# Patient Record
Sex: Male | Born: 1950 | Race: White | Hispanic: No | Smoking: Former smoker
Health system: Southern US, Community
[De-identification: ages and names within clinical notes are randomized; demographics above are authoritative.]

## PROBLEM LIST (undated history)

## (undated) DIAGNOSIS — I4729 Other ventricular tachycardia: Secondary | ICD-10-CM

## (undated) DIAGNOSIS — R51 Headache: Principal | ICD-10-CM

## (undated) DIAGNOSIS — I2119 ST elevation (STEMI) myocardial infarction involving other coronary artery of inferior wall: Secondary | ICD-10-CM

## (undated) DIAGNOSIS — S0990XA Unspecified injury of head, initial encounter: Secondary | ICD-10-CM

## (undated) DIAGNOSIS — I10 Essential (primary) hypertension: Secondary | ICD-10-CM

## (undated) DIAGNOSIS — Z720 Tobacco use: Secondary | ICD-10-CM

## (undated) DIAGNOSIS — I472 Ventricular tachycardia: Secondary | ICD-10-CM

## (undated) DIAGNOSIS — E785 Hyperlipidemia, unspecified: Secondary | ICD-10-CM

## (undated) DIAGNOSIS — I240 Acute coronary thrombosis not resulting in myocardial infarction: Secondary | ICD-10-CM

## (undated) HISTORY — DX: Hyperlipidemia, unspecified: E78.5

## (undated) HISTORY — DX: Tobacco use: Z72.0

## (undated) HISTORY — DX: Acute coronary thrombosis not resulting in myocardial infarction: I24.0

## (undated) HISTORY — DX: Unspecified injury of head, initial encounter: S09.90XA

## (undated) HISTORY — DX: Ventricular tachycardia: I47.2

## (undated) HISTORY — DX: Essential (primary) hypertension: I10

## (undated) HISTORY — DX: Other ventricular tachycardia: I47.29

## (undated) HISTORY — DX: ST elevation (STEMI) myocardial infarction involving other coronary artery of inferior wall: I21.19

## (undated) HISTORY — DX: Headache: R51

---

## 1997-08-02 ENCOUNTER — Encounter
Admission: RE | Admit: 1997-08-02 | Discharge: 1997-10-31 | Payer: Self-pay | Admitting: Physical Medicine and Rehabilitation

## 2000-11-01 ENCOUNTER — Emergency Department (HOSPITAL_COMMUNITY): Admission: EM | Admit: 2000-11-01 | Discharge: 2000-11-01 | Payer: Self-pay | Admitting: Emergency Medicine

## 2000-11-01 ENCOUNTER — Encounter: Payer: Self-pay | Admitting: Emergency Medicine

## 2000-11-02 ENCOUNTER — Emergency Department (HOSPITAL_COMMUNITY): Admission: EM | Admit: 2000-11-02 | Discharge: 2000-11-02 | Payer: Self-pay | Admitting: Emergency Medicine

## 2000-11-08 ENCOUNTER — Encounter: Payer: Self-pay | Admitting: Urology

## 2000-11-08 ENCOUNTER — Ambulatory Visit (HOSPITAL_COMMUNITY): Admission: RE | Admit: 2000-11-08 | Discharge: 2000-11-08 | Payer: Self-pay | Admitting: Urology

## 2002-12-07 ENCOUNTER — Emergency Department (HOSPITAL_COMMUNITY): Admission: EM | Admit: 2002-12-07 | Discharge: 2002-12-07 | Payer: Self-pay | Admitting: Emergency Medicine

## 2002-12-07 ENCOUNTER — Encounter: Payer: Self-pay | Admitting: Emergency Medicine

## 2004-11-01 ENCOUNTER — Ambulatory Visit (HOSPITAL_COMMUNITY): Admission: RE | Admit: 2004-11-01 | Discharge: 2004-11-01 | Payer: Self-pay | Admitting: Family Medicine

## 2006-06-14 ENCOUNTER — Observation Stay (HOSPITAL_COMMUNITY): Admission: RE | Admit: 2006-06-14 | Discharge: 2006-06-15 | Payer: Self-pay | Admitting: Urology

## 2006-06-14 ENCOUNTER — Encounter (INDEPENDENT_AMBULATORY_CARE_PROVIDER_SITE_OTHER): Payer: Self-pay | Admitting: *Deleted

## 2006-06-14 HISTORY — PX: CYSTOTOMY: SHX926

## 2007-01-13 ENCOUNTER — Ambulatory Visit (HOSPITAL_COMMUNITY): Admission: RE | Admit: 2007-01-13 | Discharge: 2007-01-13 | Payer: Self-pay | Admitting: Urology

## 2008-01-30 ENCOUNTER — Ambulatory Visit (HOSPITAL_COMMUNITY): Admission: RE | Admit: 2008-01-30 | Discharge: 2008-01-30 | Payer: Self-pay | Admitting: Family Medicine

## 2008-02-07 ENCOUNTER — Emergency Department (HOSPITAL_COMMUNITY): Admission: EM | Admit: 2008-02-07 | Discharge: 2008-02-07 | Payer: Self-pay | Admitting: Emergency Medicine

## 2008-02-12 ENCOUNTER — Encounter: Admission: RE | Admit: 2008-02-12 | Discharge: 2008-02-12 | Payer: Self-pay | Admitting: Family Medicine

## 2008-02-18 ENCOUNTER — Ambulatory Visit (HOSPITAL_COMMUNITY): Admission: RE | Admit: 2008-02-18 | Discharge: 2008-02-18 | Payer: Self-pay | Admitting: Family Medicine

## 2008-02-19 ENCOUNTER — Ambulatory Visit (HOSPITAL_COMMUNITY): Payer: Self-pay | Admitting: Psychology

## 2008-03-04 ENCOUNTER — Ambulatory Visit (HOSPITAL_COMMUNITY): Admission: RE | Admit: 2008-03-04 | Discharge: 2008-03-04 | Payer: Self-pay | Admitting: Family Medicine

## 2008-03-11 ENCOUNTER — Ambulatory Visit (HOSPITAL_COMMUNITY): Payer: Self-pay | Admitting: Psychology

## 2008-04-01 ENCOUNTER — Ambulatory Visit (HOSPITAL_COMMUNITY): Payer: Self-pay | Admitting: Psychology

## 2008-04-21 ENCOUNTER — Ambulatory Visit (HOSPITAL_COMMUNITY): Payer: Self-pay | Admitting: Psychology

## 2008-05-05 ENCOUNTER — Ambulatory Visit (HOSPITAL_COMMUNITY): Admission: RE | Admit: 2008-05-05 | Discharge: 2008-05-05 | Payer: Self-pay | Admitting: Family Medicine

## 2008-05-19 ENCOUNTER — Ambulatory Visit (HOSPITAL_COMMUNITY): Payer: Self-pay | Admitting: Psychology

## 2008-06-16 ENCOUNTER — Ambulatory Visit (HOSPITAL_COMMUNITY): Payer: Self-pay | Admitting: Psychology

## 2008-06-23 ENCOUNTER — Encounter: Admission: RE | Admit: 2008-06-23 | Discharge: 2008-06-23 | Payer: Self-pay | Admitting: Family Medicine

## 2008-09-03 ENCOUNTER — Encounter: Payer: Self-pay | Admitting: Gastroenterology

## 2008-09-15 ENCOUNTER — Encounter: Payer: Self-pay | Admitting: Gastroenterology

## 2008-09-15 ENCOUNTER — Ambulatory Visit: Payer: Self-pay | Admitting: Gastroenterology

## 2008-09-15 ENCOUNTER — Ambulatory Visit (HOSPITAL_COMMUNITY): Admission: RE | Admit: 2008-09-15 | Discharge: 2008-09-15 | Payer: Self-pay | Admitting: Gastroenterology

## 2008-10-12 ENCOUNTER — Ambulatory Visit (HOSPITAL_COMMUNITY): Admission: RE | Admit: 2008-10-12 | Discharge: 2008-10-12 | Payer: Self-pay | Admitting: Family Medicine

## 2008-12-01 ENCOUNTER — Encounter: Admission: RE | Admit: 2008-12-01 | Discharge: 2008-12-01 | Payer: Self-pay | Admitting: Family Medicine

## 2008-12-23 ENCOUNTER — Ambulatory Visit (HOSPITAL_COMMUNITY): Admission: RE | Admit: 2008-12-23 | Discharge: 2008-12-23 | Payer: Self-pay | Admitting: Urology

## 2009-01-10 ENCOUNTER — Encounter: Admission: RE | Admit: 2009-01-10 | Discharge: 2009-01-10 | Payer: Self-pay | Admitting: Family Medicine

## 2009-04-19 ENCOUNTER — Encounter: Payer: Self-pay | Admitting: Gastroenterology

## 2009-05-18 ENCOUNTER — Encounter: Admission: RE | Admit: 2009-05-18 | Discharge: 2009-05-18 | Payer: Self-pay | Admitting: Family Medicine

## 2009-05-24 ENCOUNTER — Inpatient Hospital Stay (HOSPITAL_COMMUNITY): Admission: RE | Admit: 2009-05-24 | Discharge: 2009-05-27 | Payer: Self-pay | Admitting: Chiropractic Medicine

## 2009-05-24 DIAGNOSIS — I2119 ST elevation (STEMI) myocardial infarction involving other coronary artery of inferior wall: Secondary | ICD-10-CM

## 2009-05-24 HISTORY — DX: ST elevation (STEMI) myocardial infarction involving other coronary artery of inferior wall: I21.19

## 2009-05-24 HISTORY — PX: CORONARY ANGIOPLASTY WITH STENT PLACEMENT: SHX49

## 2009-07-04 ENCOUNTER — Encounter (HOSPITAL_COMMUNITY): Admission: RE | Admit: 2009-07-04 | Discharge: 2009-08-03 | Payer: Self-pay | Admitting: Cardiovascular Disease

## 2009-08-04 ENCOUNTER — Encounter (HOSPITAL_COMMUNITY): Admission: RE | Admit: 2009-08-04 | Discharge: 2009-09-03 | Payer: Self-pay | Admitting: Cardiovascular Disease

## 2009-09-05 ENCOUNTER — Encounter (HOSPITAL_COMMUNITY): Admission: RE | Admit: 2009-09-05 | Discharge: 2009-10-05 | Payer: Self-pay | Admitting: Cardiovascular Disease

## 2009-12-22 ENCOUNTER — Ambulatory Visit (HOSPITAL_COMMUNITY): Admission: RE | Admit: 2009-12-22 | Discharge: 2009-12-22 | Payer: Self-pay | Admitting: Family Medicine

## 2010-03-27 ENCOUNTER — Ambulatory Visit: Payer: Self-pay | Admitting: Cardiovascular Disease

## 2010-07-12 LAB — BASIC METABOLIC PANEL
BUN: 9 mg/dL (ref 6–23)
CO2: 30 mEq/L (ref 19–32)
Calcium: 9.2 mg/dL (ref 8.4–10.5)
Creatinine, Ser: 0.81 mg/dL (ref 0.4–1.5)
GFR calc non Af Amer: >60 mL/min (ref >60)
Glucose, Bld: 127 mg/dL — ABNORMAL HIGH (ref 70–99)
Potassium: 4.2 mEq/L (ref 3.5–5.1)
Sodium: 136 mEq/L (ref 135–145)

## 2010-07-12 LAB — POCT CARDIAC MARKERS: Troponin i, poc: <0.05 ng/mL (ref 0.00–0.09)

## 2010-07-12 LAB — CBC
HCT: 45.8 % (ref 39.0–52.0)
MCHC: 33.6 g/dL (ref 30.0–36.0)
Platelets: 307 10*3/uL (ref 150–400)
RBC: 4.91 MIL/uL (ref 4.22–5.81)
WBC: 12.9 10*3/uL — ABNORMAL HIGH (ref 4.0–10.5)

## 2010-07-12 LAB — DIFFERENTIAL: Lymphs Abs: 2.6 10*3/uL (ref 0.7–4.0)

## 2010-07-13 LAB — BASIC METABOLIC PANEL
BUN: 12 mg/dL (ref 6–23)
Chloride: 104 mEq/L (ref 96–112)
Creatinine, Ser: 0.8 mg/dL (ref 0.4–1.5)
Sodium: 136 mEq/L (ref 135–145)

## 2010-07-13 LAB — GLUCOSE, CAPILLARY
Glucose-Capillary: 105 mg/dL — ABNORMAL HIGH (ref 70–99)
Glucose-Capillary: 109 mg/dL — ABNORMAL HIGH (ref 70–99)
Glucose-Capillary: 110 mg/dL — ABNORMAL HIGH (ref 70–99)
Glucose-Capillary: 110 mg/dL — ABNORMAL HIGH (ref 70–99)
Glucose-Capillary: 127 mg/dL — ABNORMAL HIGH (ref 70–99)
Glucose-Capillary: 136 mg/dL — ABNORMAL HIGH (ref 70–99)
Glucose-Capillary: 160 mg/dL — ABNORMAL HIGH (ref 70–99)
Glucose-Capillary: 94 mg/dL (ref 70–99)

## 2010-07-13 LAB — CBC
HCT: 41.7 % (ref 39.0–52.0)
MCHC: 33.6 g/dL (ref 30.0–36.0)
Platelets: 276 10*3/uL (ref 150–400)
RBC: 4.3 MIL/uL (ref 4.22–5.81)
RDW: 13.3 % (ref 11.5–15.5)
WBC: 11.8 10*3/uL — ABNORMAL HIGH (ref 4.0–10.5)

## 2010-07-13 LAB — CARDIAC PANEL(CRET KIN+CKTOT+MB+TROPI)
Total CK: 1533 U/L — ABNORMAL HIGH (ref 7–232)
Troponin I: 53.93 ng/mL (ref 0.00–0.06)

## 2010-07-13 LAB — MRSA PCR SCREENING: MRSA by PCR: NEGATIVE

## 2010-09-05 NOTE — Op Note (Signed)
NAME:  Garrett Mckinney, CURRIER                ACCOUNT NO.:  [id number]   MEDICAL RECORD NO.:  [id number]          PATIENT TYPE:  AMB   LOCATION:  DAY                           FACILITY:  [facility]   PHYSICIAN:  Kassie Mends, M.D.      DATE OF BIRTH:  1951-02-02   DATE OF PROCEDURE:  09/15/2008  DATE OF DISCHARGE:  09/15/2008                               OPERATIVE REPORT   PROCEDURE:  Colonoscopy with cold forceps and snare cautery polypectomy.   INDICATION FOR EXAM:  Garrett Mckinney is a 60 year old male who presents for  average-risk colon cancer screening.   FINDINGS:  1. Moderately tortuous sigmoid colon.  Flat 6-mm hepatic flexure polyp      removed via snare cautery.  Flat 6-mm descending colon polyp      removed via snare cautery.  Flat 4- to 5-mm polyp removed via cold      forceps in the descending colon.  2. Rare sigmoid colon diverticula.  Otherwise, no masses, inflammatory      changes, or AVMs seen.  3. Normal retroflexed view of the rectum.   DIAGNOSES:  1. Multiple flat polyps in the colon.  2. Mild sigmoid colon diverticulosis.   RECOMMENDATIONS:  1. Will call Garrett Mckinney with the results of his biopsies.  If he has      simple adenomas, then we will consider a colonoscopy in 5 years due      to his having flat polyps.  These polyps can be easily missed.  2. No aspirin, NSAIDs, or anticoagulation for 7 days.  3. He should follow a high-fiber diet.  He was given a handout on high-      fiber diet and polyps.   MEDICATIONS:  1. Demerol 75 mg IV.  2. Versed 5 mg IV.   PROCEDURE TECHNIQUE:  Physical exam was performed.  Informed consent was  obtained from the patient after explaining the benefits, risks, and  alternatives to the procedure.  The patient was connected to the monitor  and placed in the left lateral position.  Continuous oxygen was provided  by nasal cannula, IV medicine administered through an indwelling  cannula.  After administration of sedation and rectal exam, the  patient's rectum was intubated, and the scope was advanced under direct  visualization to the cecum.  The scope was removed slowly by carefully  examining the color, texture, anatomy, and integrity of the mucosa on  the way out.  The patient was most  comfortable during the procedure in the supine position.  The patient  was recovered in endoscopy and discharged home in satisfactory  condition.   ADDENDUM [address]:  FLAT Simple adenomas. Tortuous colon. TCS in 5 years.      Kassie Mends, M.D.  Electronically Signed     SM/MEDQ  D:  09/15/2008  T:  09/16/2008  Job:  [id number]   cc:   Garrett G. Renard Matter, MD  Fax: [phone]

## 2010-09-08 NOTE — Op Note (Signed)
NAME:  Garrett Mckinney, Garrett Mckinney                ACCOUNT NO.:  [id number]   MEDICAL RECORD NO.:  [id number]          PATIENT TYPE:  AMB   LOCATION:  DAY                           FACILITY:  [facility]   PHYSICIAN:  Ky Barban, M.D.DATE OF BIRTH:  1950-09-25   DATE OF PROCEDURE:  06/14/2006  DATE OF DISCHARGE:                                PROCEDURE NOTE   PREOPERATIVE DIAGNOSIS:  Bladder stone.   POSTOPERATIVE DIAGNOSES:  1. Bladder stone.  2. Foreign body in bladder.   PROCEDURE:  1. Cystoscopy.  2. Holmium laser litholapaxy.   ATTENDING:  Ky Barban, M.D.   ANESTHESIA:  Spinal.   PROCEDURE:  With the patient under spinal anesthesia after the usual  prep and drape, a #25 cystoscope was introduced into the bladder.  There  was a large stone in the bladder; it was whitish color and it was  treated with holmium, but it was very soft.  The stone was completely  removed; the pieces were removed with the help of an Ellik evacuator.  Then once the stone was removed.  I could see and the center of the  stone, there was a foreign body; it looks like a plastic capsule and I  could make it with laser and I could not break it with mechanical  lithotrite and I could not get it out because it was large and does not  go through the bladder neck, so at this point I decided that we need to  open up this patient.  The Foley catheter was inserted off after  removing instruments.  The patient was prepared to have open removal of  the foreign body.      Ky Barban, M.D.  Electronically Signed     MIJ/MEDQ  D:  06/14/2006  T:  06/15/2006  Job:  [id number]

## 2010-09-08 NOTE — Op Note (Signed)
NAME:  Garrett Mckinney, Garrett Mckinney                ACCOUNT NO.:  [id number]   MEDICAL RECORD NO.:  [id number]          PATIENT TYPE:  AMB   LOCATION:  DAY                           FACILITY:  [facility]   PHYSICIAN:  Ky Barban, M.D.DATE OF BIRTH:  1950-11-21   DATE OF PROCEDURE:  06/14/2006  DATE OF DISCHARGE:                               OPERATIVE REPORT   PREOPERATIVE DIAGNOSIS:  Foreign body of bladder.   POSTOPERATIVE DIAGNOSIS:  Foreign body of bladder.   PROCEDURE:  Cystotomy with open removal of the foreign body of bladder.   SURGEON:  Ky Barban, M.D.   PROCEDURE:  The patient, under same spinal anesthesia, was placed in  supine position.  I already had spoken to his wife.  I find out that he  has a girlfriend, but they lived together for the last 7 or 8 years and  I told her why we need to open up this patient.  After the usual prep  and drape, a midline incision in the suprapubic area about 2-1/2-inches  long was made and carried down through the previous scar.  The rectus  sheath was opened and recti separated in the midline.  The bladder was  filled up with 300 mL of saline.  The anterior surface of the bladder  was cleared of the fatty tissue.  Two stitches of 2-0 Vicryl were placed  in the anterior wall of the bladder and in between these 2 stitches,  cystotomy was made with the help of cautery.  Bladder was opened up.  The inside of the bladder was inspected.  The foreign body was  visualized; it was grabbed with the help of an open sponge stick and  removed without any problem.  The bladder was closed in 3 layers using 2-  0 Vicryl.  The bladder was filled up with 300 mL and no leakage.  The  prevesical space was drained with a Jackson-Pratt drain which came out  through a separate stab wound and stabilized to the skin with 3-0 nylon  stitch.  After closing the rectus sheath with 0 Vicryl continuous  stitch, the subcutaneous tissue was approximated with 3-0 plain  catgut.  Skin was closed with staples.  Sterile gauze dressing was applied, Foley  catheter attached to straight drainage.  The patient left the operating  room in satisfactory, stable condition.      Ky Barban, M.D.  Electronically Signed     MIJ/MEDQ  D:  06/14/2006  T:  06/15/2006  Job:  [id number]

## 2010-09-08 NOTE — H&P (Signed)
NAME:  Garrett Mckinney, Garrett Mckinney                ACCOUNT NO.:  [id number]   MEDICAL RECORD NO.:  [id number]          PATIENT TYPE:  AMB   LOCATION:  DAY                           FACILITY:  [facility]   PHYSICIAN:  Ky Barban, M.D.DATE OF BIRTH:  1950-07-24   DATE OF ADMISSION:  DATE OF DISCHARGE:  LH                              HISTORY & PHYSICAL   This 60 year old gentleman is well known to me.  He has a history of  kidney stones.  He has undergone ESL for left renal calculus several  years ago.  About a month ago he presented with urgency and frequency  and difficulty to void.  He underwent complete workup, and he was found  to have a 3-cm stone in the bladder.  He was also having pain in his  right flank.  He has a 5-mm stone in the right mid-calyx.  I have  suggested to him that he underwent litholapaxy for bladder stone with  holmium laser, and once we get rid of this stone, then we can go ahead  and do a ESL on the right renal calculus.  I have explained the  procedure limitations and complications to him.  He understands and  wants me to go ahead and proceed.   PAST MEDICAL HISTORY:  1. History of passing several renal calculi in the past.  2. No history of hypertension.  3. Type 2 diabetes.   SOCIAL HISTORY:  He smokes one pack a day.  He does not drink.   REVIEW OF SYSTEMS:  Denies any chest pain, orthopnea, PND, nausea,  vomiting.   PHYSICAL EXAMINATION:  GENERAL:  Well-nourished, well-developed male not  in acute distress, full conscious, alert and oriented.  CENTRAL NERVOUS SYSTEM:  Negative.  CHEST:  Symmetrical.  HEENT:  Negative.  HEART:  Regular sinus rhythm.  No murmur.  ABDOMEN:  Soft, flat.  Liver, spleen, and kidneys are not palpable.  GU:  External genitalia:  Circumcised.  Meatus __________ .  Testicles  are normal.  RECTAL:  No rectal mass.  Prostate 1.5+, smooth, and firm.   IMPRESSION:  1. Bladder stone.  2. Right renal calculus.   PLAN:  Litholapaxy with  holmium laser of the bladder stone as an  outpatient with anesthesia.      Ky Barban, M.D.  Electronically Signed     MIJ/MEDQ  D:  06/13/2006  T:  06/13/2006  Job:  [id number]

## 2010-10-26 ENCOUNTER — Ambulatory Visit (HOSPITAL_COMMUNITY)
Admission: RE | Admit: 2010-10-26 | Discharge: 2010-10-26 | Disposition: A | Discharge status: Home / Self Care | Payer: Medicare Other | Source: Ambulatory Visit | Attending: Family Medicine | Admitting: Family Medicine

## 2010-10-26 ENCOUNTER — Other Ambulatory Visit (HOSPITAL_COMMUNITY): Payer: Self-pay | Admitting: Family Medicine

## 2010-10-26 DIAGNOSIS — R509 Fever, unspecified: Secondary | ICD-10-CM | POA: Insufficient documentation

## 2010-10-26 DIAGNOSIS — R51 Headache: Secondary | ICD-10-CM

## 2010-10-26 DIAGNOSIS — R05 Cough: Secondary | ICD-10-CM

## 2010-10-26 DIAGNOSIS — R059 Cough, unspecified: Secondary | ICD-10-CM | POA: Insufficient documentation

## 2011-01-12 ENCOUNTER — Encounter: Payer: Self-pay | Admitting: *Deleted

## 2011-01-23 ENCOUNTER — Encounter: Payer: Self-pay | Admitting: Cardiovascular Disease

## 2011-01-23 ENCOUNTER — Ambulatory Visit (INDEPENDENT_AMBULATORY_CARE_PROVIDER_SITE_OTHER): Payer: Medicare Other | Admitting: Cardiovascular Disease

## 2011-01-23 VITALS — BP 123/76 | HR 58 | Ht 71.0 in | Wt 190.0 lb

## 2011-01-23 DIAGNOSIS — E785 Hyperlipidemia, unspecified: Secondary | ICD-10-CM

## 2011-01-23 DIAGNOSIS — I2510 Atherosclerotic heart disease of native coronary artery without angina pectoris: Secondary | ICD-10-CM | POA: Insufficient documentation

## 2011-01-23 LAB — HEPATIC FUNCTION PANEL
Albumin: 3.6 g/dL (ref 3.5–5.2)
Alkaline Phosphatase: 66 U/L (ref 39–117)
Bilirubin, Direct: 0.1 mg/dL (ref 0.0–0.3)
Total Protein: 7.3 g/dL (ref 6.0–8.3)

## 2011-01-23 LAB — BASIC METABOLIC PANEL
BUN: 9 mg/dL (ref 6–23)
CO2: 30 mEq/L (ref 19–32)
Chloride: 104 mEq/L (ref 96–112)
Creatinine, Ser: 0.7 mg/dL (ref 0.4–1.5)
Potassium: 4.1 mEq/L (ref 3.5–5.1)

## 2011-01-23 LAB — LIPID PANEL
LDL Cholesterol: 58 mg/dL (ref 0–99)
Total CHOL/HDL Ratio: 3
VLDL: 14 mg/dL (ref 0.0–40.0)

## 2011-01-23 MED ORDER — NITROGLYCERIN 0.4 MG SL SUBL: 0.4000 mg | SUBLINGUAL_TABLET | SUBLINGUAL | Status: DC | PRN

## 2011-01-23 MED ORDER — ATORVASTATIN CALCIUM 40 MG PO TABS: 40.0000 mg | ORAL_TABLET | Freq: Every day | ORAL | Status: DC

## 2011-01-23 NOTE — Assessment & Plan Note (Signed)
He is doing well.  No angina.  Continue current meds 

## 2011-01-23 NOTE — Progress Notes (Signed)
Garrett Mckinney Date of Birth  08-25-1950 [facility] [address]    [address] [place], [place]  [address] [phone]  Fax  [phone]  History of Present Illness: 60 year old gentleman with a history of coronary artery disease 3 status post stenting. He also has a history of hyperlipidemia and hypertension.  He's not had any episodes of chest pain. He has occasional episodes of shortness of breath.     No Known Allergies  Past Medical History  Diagnosis Date  . Myocardial infarction, inferior wall 05/24/2009    Acute inferior lateral wall myocardial infarction  . 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 Hyperlipidemia   . NSVT (nonsustained ventricular tachycardia)   . Dyslipidemia   . Hypertension   . Diabetes mellitus   . Tobacco abuse     Past Surgical History  Procedure Date  . Coronary angioplasty with stent placement 05/24/2009    Est. EF is 70-75% -- stenting of the mid/distal left circumflex artery using a 3.0 x 18 mm PROMUS stent       . Cystotomy 06/14/2006    Cystotomy with open removal of the foreign body of bladder    History  Smoking status  . Current Some Day Smoker -- 1.0 packs/day for 46 years  . Types: Cigarettes  Smokeless tobacco  . Not on file    History  Alcohol Use  . Yes    History reviewed. No pertinent family history.  Reviw of Systems:  Reviewed in the HPI.  All other systems are negative.  Physical Exam: BP 123/76  Pulse 58  Ht 5\' 11"  (1.803 m)  Wt 190 lb (86.183 kg)  BMI 26.50 kg/m2 The patient is alert and oriented x 3.  The mood and affect are normal.   Skin: warm and dry.  Color is normal.    HEENT:   the sclera are nonicteric.  The mucous membranes are moist.  The carotids are 2+ without bruits.  There is no thyromegaly.  There is no JVD.    Lungs: clear.  The chest wall is non tender.    Heart: regular rate with a normal S1 and S2.  There are no murmurs,  gallops, or rubs. The PMI is not displaced.     Abdomen: good bowel sounds.  There is no guarding or rebound.  There is no hepatosplenomegaly or tenderness.  There are no masses.   Extremities:  no clubbing, cyanosis, or edema.  The legs are without rashes.  The distal pulses are intact.   Neuro:  Cranial nerves II - XII are intact.  Motor and sensory functions are intact.    The gait is normal.  ECG:  Assessment / Plan:

## 2011-01-23 NOTE — Patient Instructions (Signed)
Your physician wants you to follow-up in:6 months.  You will receive a reminder letter in the mail two months in advance. If you don't receive a letter, please call our office to schedule the follow-up appointment. You will need repeat fasting labs bmp lipids and hfp.  Your physician recommends that you have labs today and on your 6 month visit return.

## 2011-06-19 ENCOUNTER — Other Ambulatory Visit: Payer: Self-pay | Admitting: *Deleted

## 2011-06-19 MED ORDER — METOPROLOL TARTRATE 25 MG PO TABS: 25.0000 mg | ORAL_TABLET | Freq: Two times a day (BID) | ORAL | Status: DC

## 2011-07-25 ENCOUNTER — Other Ambulatory Visit: Payer: Self-pay | Admitting: *Deleted

## 2011-07-25 MED ORDER — ATORVASTATIN CALCIUM 40 MG PO TABS: 40.0000 mg | ORAL_TABLET | Freq: Every day | ORAL | Status: DC

## 2011-07-25 MED ORDER — CLOPIDOGREL BISULFATE 75 MG PO TABS: 75.0000 mg | ORAL_TABLET | Freq: Every day | ORAL | Status: DC

## 2011-07-25 NOTE — Telephone Encounter (Signed)
Fax Received. Refill Completed. Taylyn Brame Chowoe (R.M.A)   

## 2011-07-25 NOTE — Telephone Encounter (Signed)
Verbal Refill with April the Pharmacist. Refill Completed. Jordanna Hendrie Chowoe (R.M.A)

## 2011-07-30 ENCOUNTER — Encounter: Payer: Self-pay | Admitting: Cardiovascular Disease

## 2011-07-30 ENCOUNTER — Ambulatory Visit (INDEPENDENT_AMBULATORY_CARE_PROVIDER_SITE_OTHER): Payer: Medicare Other | Admitting: Cardiovascular Disease

## 2011-07-30 VITALS — BP 150/77 | HR 57 | Ht 71.0 in | Wt 194.6 lb

## 2011-07-30 DIAGNOSIS — J40 Bronchitis, not specified as acute or chronic: Secondary | ICD-10-CM

## 2011-07-30 DIAGNOSIS — E785 Hyperlipidemia, unspecified: Secondary | ICD-10-CM

## 2011-07-30 DIAGNOSIS — I2510 Atherosclerotic heart disease of native coronary artery without angina pectoris: Secondary | ICD-10-CM

## 2011-07-30 LAB — HEPATIC FUNCTION PANEL
AST: 20 U/L (ref 0–37)
Albumin: 3.9 g/dL (ref 3.5–5.2)
Alkaline Phosphatase: 68 U/L (ref 39–117)
Bilirubin, Direct: 0.1 mg/dL (ref 0.0–0.3)
Total Protein: 7.4 g/dL (ref 6.0–8.3)

## 2011-07-30 LAB — LIPID PANEL
Cholesterol: 125 mg/dL (ref 0–200)
LDL Cholesterol: 70 mg/dL (ref 0–99)
Total CHOL/HDL Ratio: 4
Triglycerides: 110 mg/dL (ref 0.0–149.0)

## 2011-07-30 LAB — BASIC METABOLIC PANEL
BUN: 12 mg/dL (ref 6–23)
Calcium: 9 mg/dL (ref 8.4–10.5)
Chloride: 105 mEq/L (ref 96–112)
Creatinine, Ser: 0.7 mg/dL (ref 0.4–1.5)

## 2011-07-30 MED ORDER — ALBUTEROL SULFATE HFA 108 (90 BASE) MCG/ACT IN AERS: 2.0000 | INHALATION_SPRAY | Freq: Four times a day (QID) | RESPIRATORY_TRACT | Status: DC | PRN

## 2011-07-30 NOTE — Assessment & Plan Note (Signed)
He has significant wheezing today particularly on the right side. We will give him a trial of albuterol. We'll have him get further prescriptions from his medical Dr.

## 2011-07-30 NOTE — Patient Instructions (Addendum)
Your physician wants you to follow-up in: 6 months  You will receive a reminder letter in the mail two months in advance. If you don't receive a letter, please call our office to schedule the follow-up appointment.  Your physician has recommended you make the following change in your medication:   Start albuterol HFA proair. 2 puffs every 6 hours  Pt to start accelerate program.

## 2011-07-30 NOTE — Assessment & Plan Note (Addendum)
Garrett Mckinney is doing very well. He's not had any episodes of chest pain or shortness of breath.  I've encouraged him to get out and gets more exercise. I encouraged him to stop smoking. He does have some wheezing for which give him a trial of albuterol.    We discussed the ACCELERATE trial.  He is willing to learn more about it.

## 2011-07-30 NOTE — Progress Notes (Signed)
Garrett Mckinney Date of Birth  [birth year] [facility] [address]    [address] [place], [place]  [address] [phone]  Fax  [phone]   Problems: 1. Coronary artery disease-status post inferior lateral wall myocardial infarction daily status post PTCA and stenting of the left circumflex artery using a 3.0 x 18 mm Promus stent. It was post dilated using a 3.5 x 15 mm Quantum apex (February, 2011) 2. Chronic total occlusion of the right coronary artery 3. Hyperlipidemia 4. Nonsustained ventricular tachycardia 5. Continued cigarette smoking  History of Present Illness: 61 year old gentleman with a history of coronary artery disease -status post stenting. He also has a history of hyperlipidemia and hypertension.  He's not had any episodes of chest pain. He has occasional episodes of shortness of breath.  He is still smoking but is committed to quitting.    No Known Allergies  Past Medical History  Diagnosis Date  . Myocardial infarction, inferior wall 05/24/2009    Acute inferior lateral wall myocardial infarction  . 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 Hyperlipidemia   . NSVT (nonsustained ventricular tachycardia)   . Dyslipidemia   . Hypertension   . Diabetes mellitus   . Tobacco abuse     Past Surgical History  Procedure Date  . Coronary angioplasty with stent placement 05/24/2009    Est. EF is 70-75% -- stenting of the mid/distal left circumflex artery using a 3.0 x 18 mm PROMUS stent       . Cystotomy 06/14/2006    Cystotomy with open removal of the foreign body of bladder    History  Smoking status  . Current Some Day Smoker -- 1.0 packs/day for 46 years  . Types: Cigarettes  Smokeless tobacco  . Not on file    History  Alcohol Use  . Yes    No family history on file.  Reviw of Systems:  Reviewed in the HPI.  All other systems are negative.  Physical Exam: BP 150/77  Pulse 57  Ht 5\' 11"   (1.803 m)  Wt 194 lb 9.6 oz (88.27 kg)  BMI 27.14 kg/m2 The patient is alert and oriented x 3.  The mood and affect are normal.   Skin: warm and dry.  Color is normal.    HEENT:   the sclera are nonicteric.  The mucous membranes are moist.  The carotids are 2+ without bruits.  There is no thyromegaly.  There is no JVD.    Lungs: Wheezing-particularly on the right side.  The chest wall is non tender.    Heart: regular rate with a normal S1 and S2.  There are no murmurs, gallops, or rubs. The PMI is not displaced.     Abdomen: good bowel sounds.  There is no guarding or rebound.  There is no hepatosplenomegaly or tenderness.  There are no masses.   Extremities:  no clubbing, cyanosis, or edema.  The legs are without rashes.  The distal pulses are intact.   Neuro:  Cranial nerves II - XII are intact.  Motor and sensory functions are intact.    The gait is normal.  ECG: 07/30/2011 -Sinus bradycardia. There is early repolarization in the anterior leads. Assessment / Plan:

## 2012-02-05 ENCOUNTER — Ambulatory Visit (INDEPENDENT_AMBULATORY_CARE_PROVIDER_SITE_OTHER): Payer: Medicare Other | Admitting: Cardiovascular Disease

## 2012-02-05 ENCOUNTER — Encounter: Payer: Self-pay | Admitting: Cardiovascular Disease

## 2012-02-05 VITALS — BP 156/86 | HR 45 | Ht 71.0 in | Wt 188.0 lb

## 2012-02-05 DIAGNOSIS — I2510 Atherosclerotic heart disease of native coronary artery without angina pectoris: Secondary | ICD-10-CM

## 2012-02-05 DIAGNOSIS — E785 Hyperlipidemia, unspecified: Secondary | ICD-10-CM

## 2012-02-05 MED ORDER — LISINOPRIL 10 MG PO TABS: 10.0000 mg | ORAL_TABLET | Freq: Every day | ORAL | Status: DC

## 2012-02-05 MED ORDER — CLOPIDOGREL BISULFATE 75 MG PO TABS: 75.0000 mg | ORAL_TABLET | Freq: Every day | ORAL | Status: DC

## 2012-02-05 MED ORDER — METOPROLOL TARTRATE 25 MG PO TABS: 25.0000 mg | ORAL_TABLET | Freq: Two times a day (BID) | ORAL | Status: DC

## 2012-02-05 MED ORDER — ATORVASTATIN CALCIUM 40 MG PO TABS: 40.0000 mg | ORAL_TABLET | Freq: Every day | ORAL | Status: DC

## 2012-02-05 NOTE — Patient Instructions (Addendum)
Your physician wants you to follow-up in: 6 months You will receive a reminder letter in the mail two months in advance. If you don't receive a letter, please call our office to schedule the follow-up appointment.  Your physician recommends that you return for a FASTING lipid profile: today and in 6 months  

## 2012-02-05 NOTE — Progress Notes (Signed)
Garrett Mckinney Date of Birth  09-05-1950 [facility] [address]    [address] [place], [place]  [address] [phone]  Fax  [phone]   Problems: 1. Coronary artery disease-status post inferior lateral wall myocardial infarction daily status post PTCA and stenting of the left circumflex artery using a 3.0 x 18 mm Promus stent. It was post dilated using a 3.5 x 15 mm Quantum apex (February, 2011) 2. Chronic total occlusion of the right coronary artery 3. Hyperlipidemia 4. Nonsustained ventricular tachycardia 5. Continued cigarette smoking  History of Present Illness: 61 year old gentleman with a history of coronary artery disease -status post stenting. He also has a history of hyperlipidemia and hypertension.  He's not had any episodes of chest pain. He has occasional episodes of shortness of breath.  He is still smoking but is committed to quitting.  He is still smoking some.  He got a filter that supposed to take the tar out of the smoke.  No Known Allergies  Past Medical History  Diagnosis Date  . Myocardial infarction, inferior wall 05/24/2009    Acute inferior lateral wall myocardial infarction  . 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 Hyperlipidemia   . NSVT (nonsustained ventricular tachycardia)   . Dyslipidemia   . Hypertension   . Diabetes mellitus   . Tobacco abuse     Past Surgical History  Procedure Date  . Coronary angioplasty with stent placement 05/24/2009    Est. EF is 70-75% -- stenting of the mid/distal left circumflex artery using a 3.0 x 18 mm PROMUS stent       . Cystotomy 06/14/2006    Cystotomy with open removal of the foreign body of bladder    History  Smoking status  . Current Some Day Smoker -- 1.0 packs/day for 46 years  . Types: Cigarettes  Smokeless tobacco  . Not on file    History  Alcohol Use  . Yes    No family history on file.  Reviw of Systems:  Reviewed in the  HPI.  All other systems are negative.  Physical Exam: BP 156/86  Pulse 45  Ht 5\' 11"  (1.803 m)  Wt 188 lb (85.276 kg)  BMI 26.22 kg/m2  SpO2 98% The patient is alert and oriented x 3.  The mood and affect are normal.   Skin: warm and dry.  Color is normal.    HEENT:   the sclera are nonicteric.  The mucous membranes are moist.  The carotids are 2+ without bruits.  There is no thyromegaly.  There is no JVD.    Lungs: Wheezing-particularly on the right side.  The chest wall is non tender.    Heart: regular rate with a normal S1 paradoxical splitting of the S2.  There are no murmurs, gallops, or rubs. The PMI is not displaced.     Abdomen: good bowel sounds.  There is no guarding or rebound.  There is no hepatosplenomegaly or tenderness.  There are no masses.   Extremities:  no clubbing, cyanosis, or edema.  The legs are without rashes.  The distal pulses are intact.   Neuro:  Cranial nerves II - XII are intact.  Motor and sensory functions are intact.    The gait is normal.  ECG: 02/05/2012-normal sinus rhythm at 61 beats a minute he has occasional premature atrial contractions. Assessment / Plan:

## 2012-02-05 NOTE — Assessment & Plan Note (Signed)
Heart is doing well. He's not having episodes of chest pain or shortness of breath. We'll continue the same medications. I'll see him again in 6 months. We'll check fasting lipids at that time.

## 2012-02-06 LAB — BASIC METABOLIC PANEL
BUN: 11 mg/dL (ref 6–23)
Calcium: 9.3 mg/dL (ref 8.4–10.5)
Creatinine, Ser: 0.7 mg/dL (ref 0.4–1.5)
GFR: 116.98 mL/min (ref >60.00)
Glucose, Bld: 97 mg/dL (ref 70–99)
Potassium: 5.7 mEq/L — ABNORMAL HIGH (ref 3.5–5.1)

## 2012-02-06 LAB — HEPATIC FUNCTION PANEL
AST: 22 U/L (ref 0–37)
Albumin: 3.6 g/dL (ref 3.5–5.2)
Total Bilirubin: 0.5 mg/dL (ref 0.3–1.2)

## 2012-02-06 LAB — LIPID PANEL
Cholesterol: 142 mg/dL (ref 0–200)
LDL Cholesterol: 50 mg/dL (ref 0–99)
Triglycerides: 66 mg/dL (ref 0.0–149.0)
VLDL: 13.2 mg/dL (ref 0.0–40.0)

## 2012-02-07 ENCOUNTER — Other Ambulatory Visit: Payer: Self-pay | Admitting: *Deleted

## 2012-02-07 NOTE — Telephone Encounter (Signed)
Opened in Error.

## 2012-02-14 ENCOUNTER — Other Ambulatory Visit: Payer: Self-pay | Admitting: *Deleted

## 2012-02-14 MED ORDER — CLOPIDOGREL BISULFATE 75 MG PO TABS: 75.0000 mg | ORAL_TABLET | Freq: Every day | ORAL | Status: DC

## 2012-02-14 MED ORDER — METOPROLOL TARTRATE 25 MG PO TABS: 25.0000 mg | ORAL_TABLET | Freq: Two times a day (BID) | ORAL | Status: DC

## 2012-02-14 MED ORDER — LISINOPRIL 10 MG PO TABS: 10.0000 mg | ORAL_TABLET | Freq: Every day | ORAL | Status: DC

## 2012-02-14 NOTE — Addendum Note (Signed)
Addended by: Marrion Coy L on: 02/14/2012 02:15 PM   Modules accepted: Orders

## 2012-02-20 ENCOUNTER — Other Ambulatory Visit: Payer: Self-pay | Admitting: *Deleted

## 2012-02-20 DIAGNOSIS — I2510 Atherosclerotic heart disease of native coronary artery without angina pectoris: Secondary | ICD-10-CM

## 2012-02-26 ENCOUNTER — Telehealth: Payer: Self-pay | Admitting: *Deleted

## 2012-02-26 NOTE — Telephone Encounter (Signed)
Sent results of labs to be scanned.

## 2012-02-26 NOTE — Telephone Encounter (Signed)
We saw patient for visit 005 in the Accelerate study.We drew labs and patient had potassium 5.2 with range of 3.5-5.29mEq/l.I contacted Jolene and labs will be scanned for viewing in EPIC. I left patient a message on answering sevice that labs was okay and would not need to repeat lab.Patient is to call me if any questions.

## 2012-02-26 NOTE — Telephone Encounter (Signed)
Noted  

## 2012-02-27 ENCOUNTER — Other Ambulatory Visit: Payer: Medicare Other

## 2012-08-01 ENCOUNTER — Other Ambulatory Visit: Payer: Self-pay | Admitting: *Deleted

## 2012-08-01 MED ORDER — ATORVASTATIN CALCIUM 40 MG PO TABS: 40.0000 mg | ORAL_TABLET | Freq: Every day | ORAL | Status: DC

## 2012-08-01 NOTE — Telephone Encounter (Signed)
Pt calling for refill. Fax Received. Refill Completed. Felicity Penix Chowoe (R.M.A)

## 2012-08-08 ENCOUNTER — Ambulatory Visit (INDEPENDENT_AMBULATORY_CARE_PROVIDER_SITE_OTHER): Payer: Medicare Other | Admitting: Cardiovascular Disease

## 2012-08-08 ENCOUNTER — Encounter: Payer: Self-pay | Admitting: Cardiovascular Disease

## 2012-08-08 ENCOUNTER — Other Ambulatory Visit: Payer: Medicare Other

## 2012-08-08 VITALS — BP 154/80 | HR 54 | Ht 71.0 in | Wt 197.0 lb

## 2012-08-08 DIAGNOSIS — I2510 Atherosclerotic heart disease of native coronary artery without angina pectoris: Secondary | ICD-10-CM

## 2012-08-08 DIAGNOSIS — E785 Hyperlipidemia, unspecified: Secondary | ICD-10-CM

## 2012-08-08 LAB — HEPATIC FUNCTION PANEL
ALT: 19 U/L (ref 0–53)
Albumin: 3.6 g/dL (ref 3.5–5.2)
Bilirubin, Direct: 0.1 mg/dL (ref 0.0–0.3)
Total Protein: 7.1 g/dL (ref 6.0–8.3)

## 2012-08-08 LAB — BASIC METABOLIC PANEL
BUN: 14 mg/dL (ref 6–23)
Chloride: 106 mEq/L (ref 96–112)
GFR: 126.9 mL/min (ref >60.00)
Potassium: 4.2 mEq/L (ref 3.5–5.1)
Sodium: 140 mEq/L (ref 135–145)

## 2012-08-08 LAB — LIPID PANEL
Cholesterol: 145 mg/dL (ref 0–200)
HDL: 78.5 mg/dL (ref >39.00)
LDL Cholesterol: 59 mg/dL (ref 0–99)
Triglycerides: 40 mg/dL (ref 0.0–149.0)
VLDL: 8 mg/dL (ref 0.0–40.0)

## 2012-08-08 NOTE — Progress Notes (Signed)
Garrett Mckinney Date of Birth  06/02/1950  [facility] [address]    [address] [place], [place]  [address] [phone]  Fax  [phone]   Problems: 1. Coronary artery disease-status post inferior lateral wall myocardial infarction daily status post PTCA and stenting of the left circumflex artery using a 3.0 x 18 mm Promus stent. It was post dilated using a 3.5 x 15 mm Quantum apex (February, 2011) 2. Chronic total occlusion of the right coronary artery 3. Hyperlipidemia 4. Nonsustained ventricular tachycardia 5. Continued cigarette smoking  History of Present Illness: 62 year old gentleman with a history of coronary artery disease -status post stenting. He also has a history of hyperlipidemia and hypertension.  He's not had any episodes of chest pain. He has occasional episodes of shortness of breath.  He is still smoking but is committed to quitting.  He is still smoking some.  He got a filter that supposed to take the tar out of the smoke.  August 08, 2012   he has stopped smoking 6 weeks ago.  He is feeling much better.    Denies angina. Working    No Known Allergies  Past Medical History  Diagnosis Date  . Myocardial infarction, inferior wall 05/24/2009    Acute inferior lateral wall myocardial infarction  . 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 Hyperlipidemia   . NSVT (nonsustained ventricular tachycardia)   . Dyslipidemia   . Hypertension   . Diabetes mellitus   . Tobacco abuse     Past Surgical History  Procedure Laterality Date  . Coronary angioplasty with stent placement  05/24/2009    Est. EF is 70-75% -- stenting of the mid/distal left circumflex artery using a 3.0 x 18 mm PROMUS stent       . Cystotomy  06/14/2006    Cystotomy with open removal of the foreign body of bladder    History  Smoking status  . Former Smoker -- 1.00 packs/day for 46 years  . Types: Cigarettes  . Quit date:  07/04/2012  Smokeless tobacco  . Not on file    History  Alcohol Use  . Yes    No family history on file.  Reviw of Systems:  Reviewed in the HPI.  All other systems are negative.  Physical Exam: BP 154/80  Pulse 54  Ht 5\' 11"  (1.803 m)  Wt 197 lb (89.359 kg)  BMI 27.49 kg/m2  SpO2 94% The patient is alert and oriented x 3.  The mood and affect are normal.   Skin: warm and dry.  Color is normal.    HEENT:   the sclera are nonicteric.  The mucous membranes are moist.  The carotids are 2+ without bruits.  There is no thyromegaly.  There is no JVD.    Lungs: Wheezing-particularly on the right side.  The chest wall is non tender.    Heart: regular rate with a normal S1 paradoxical splitting of the S2.  There are no murmurs, gallops, or rubs. The PMI is not displaced.     Abdomen: good bowel sounds.  There is no guarding or rebound.  There is no hepatosplenomegaly or tenderness.  There are no masses.   Extremities:  no clubbing, cyanosis, or edema.  The legs are without rashes.  The distal pulses are intact.   Neuro:  Cranial nerves II - XII are intact.  Motor and sensory functions are intact.    The  gait is normal.  ECG: 02/05/2012-normal sinus rhythm at 61 beats a minute he has occasional premature atrial contractions. Assessment / Plan:

## 2012-08-08 NOTE — Assessment & Plan Note (Signed)
Garrett Mckinney is doing well. He's not had any episodes of angina. Fortunately, he has stopped smoking. He's feeling much better and breathing much better.  We'll check fasting labs today including lipid profile, hepatic profile, and basic metabolic profile. I seen again in 6 months for repeat lab work.

## 2012-08-08 NOTE — Patient Instructions (Addendum)
Your physician recommends that you return for a FASTING lipid profile: TODAY  Your physician wants you to follow-up in:6 MONTHS  You will receive a reminder letter in the mail two months in advance. If you don't receive a letter, please call our office to schedule the follow-up appointment.  Your physician recommends that you continue on your current medications as directed. Please refer to the Current Medication list given to you today.  

## 2012-08-27 ENCOUNTER — Encounter (INDEPENDENT_AMBULATORY_CARE_PROVIDER_SITE_OTHER): Payer: Medicare Other

## 2012-08-27 DIAGNOSIS — R0989 Other specified symptoms and signs involving the circulatory and respiratory systems: Secondary | ICD-10-CM

## 2012-09-02 ENCOUNTER — Telehealth: Payer: Self-pay | Admitting: Cardiovascular Disease

## 2012-12-17 ENCOUNTER — Other Ambulatory Visit (HOSPITAL_COMMUNITY): Payer: Self-pay | Admitting: Urology

## 2012-12-17 DIAGNOSIS — N452 Orchitis: Secondary | ICD-10-CM

## 2012-12-19 ENCOUNTER — Ambulatory Visit (HOSPITAL_COMMUNITY)
Admission: RE | Admit: 2012-12-19 | Discharge: 2012-12-19 | Disposition: A | Discharge status: Home / Self Care | Payer: Medicare Other | Source: Ambulatory Visit | Attending: Urology | Admitting: Urology

## 2012-12-19 ENCOUNTER — Other Ambulatory Visit (HOSPITAL_COMMUNITY): Payer: Self-pay | Admitting: Urology

## 2012-12-19 DIAGNOSIS — N453 Epididymo-orchitis: Secondary | ICD-10-CM | POA: Insufficient documentation

## 2012-12-19 DIAGNOSIS — N452 Orchitis: Secondary | ICD-10-CM

## 2012-12-19 DIAGNOSIS — N509 Disorder of male genital organs, unspecified: Secondary | ICD-10-CM | POA: Insufficient documentation

## 2012-12-19 DIAGNOSIS — N433 Hydrocele, unspecified: Secondary | ICD-10-CM | POA: Insufficient documentation

## 2013-01-23 ENCOUNTER — Other Ambulatory Visit: Payer: Self-pay | Admitting: Cardiovascular Disease

## 2013-01-27 ENCOUNTER — Other Ambulatory Visit: Payer: Self-pay | Admitting: *Deleted

## 2013-01-27 MED ORDER — ATORVASTATIN CALCIUM 40 MG PO TABS: ORAL_TABLET | ORAL | Status: DC

## 2013-02-03 ENCOUNTER — Other Ambulatory Visit: Payer: Self-pay | Admitting: *Deleted

## 2013-02-03 ENCOUNTER — Ambulatory Visit (INDEPENDENT_AMBULATORY_CARE_PROVIDER_SITE_OTHER): Payer: Medicare Other | Admitting: Cardiovascular Disease

## 2013-02-03 ENCOUNTER — Encounter: Payer: Self-pay | Admitting: Cardiovascular Disease

## 2013-02-03 VITALS — BP 160/78 | HR 51 | Ht 71.0 in | Wt 187.0 lb

## 2013-02-03 DIAGNOSIS — E785 Hyperlipidemia, unspecified: Secondary | ICD-10-CM

## 2013-02-03 DIAGNOSIS — I2510 Atherosclerotic heart disease of native coronary artery without angina pectoris: Secondary | ICD-10-CM

## 2013-02-03 LAB — LIPID PANEL
Cholesterol: 165 mg/dL (ref 0–200)
Triglycerides: 94 mg/dL (ref 0.0–149.0)
VLDL: 18.8 mg/dL (ref 0.0–40.0)

## 2013-02-03 LAB — HEPATIC FUNCTION PANEL
ALT: 14 U/L (ref 0–53)
Bilirubin, Direct: 0 mg/dL (ref 0.0–0.3)
Total Protein: 7.3 g/dL (ref 6.0–8.3)

## 2013-02-03 LAB — BASIC METABOLIC PANEL
BUN: 11 mg/dL (ref 6–23)
GFR: 122.48 mL/min (ref >60.00)
Potassium: 4.3 mEq/L (ref 3.5–5.1)
Sodium: 141 mEq/L (ref 135–145)

## 2013-02-03 MED ORDER — METOPROLOL TARTRATE 25 MG PO TABS: 25.0000 mg | ORAL_TABLET | Freq: Two times a day (BID) | ORAL | Status: DC

## 2013-02-03 MED ORDER — CLOPIDOGREL BISULFATE 75 MG PO TABS: 75.0000 mg | ORAL_TABLET | Freq: Every day | ORAL | Status: DC

## 2013-02-03 MED ORDER — NITROGLYCERIN 0.4 MG SL SUBL: 0.4000 mg | SUBLINGUAL_TABLET | SUBLINGUAL | Status: DC | PRN

## 2013-02-03 MED ORDER — LISINOPRIL 10 MG PO TABS: 10.0000 mg | ORAL_TABLET | Freq: Every day | ORAL | Status: DC

## 2013-02-03 NOTE — Assessment & Plan Note (Signed)
Ebony is doing well.  Continue current meds.  Will check fasting lipids, liver, BMP.  Will see him in 1 year

## 2013-02-03 NOTE — Progress Notes (Signed)
Garrett Mckinney Date of Birth  Sep 21, 1950 [facility] [address]    [address] [place], [place]  [address] [phone]  Fax  [phone]   Problems: 1. Coronary artery disease-status post inferior lateral wall myocardial infarction daily status post PTCA and stenting of the left circumflex artery using a 3.0 x 18 mm Promus stent. It was post dilated using a 3.5 x 15 mm Quantum apex (February, 2011) 2. Chronic total occlusion of the right coronary artery 3. Hyperlipidemia 4. Nonsustained ventricular tachycardia 5. Continued cigarette smoking  History of Present Illness: 62 year old gentleman with a history of coronary artery disease -status post stenting. He also has a history of hyperlipidemia and hypertension.  He's not had any episodes of chest pain. He has occasional episodes of shortness of breath.  He is still smoking but is committed to quitting.  He is still smoking some.  He got a filter that supposed to take the tar out of the smoke.  August 08, 2012   he has stopped smoking 6 weeks ago.  He is feeling much better.    Denies angina. Working   Oct. 14, 2014:  Garrett Mckinney is doing well.  He has not had any chest pain.  He is concerned about his grandson who recently broke his left wrist.     No Known Allergies  Past Medical History  Diagnosis Date  . Myocardial infarction, inferior wall 05/24/2009    Acute inferior lateral wall myocardial infarction  . 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 Hyperlipidemia   . NSVT (nonsustained ventricular tachycardia)   . Dyslipidemia   . Hypertension   . Diabetes mellitus   . Tobacco abuse     Past Surgical History  Procedure Laterality Date  . Coronary angioplasty with stent placement  05/24/2009    Est. EF is 70-75% -- stenting of the mid/distal left circumflex artery using a 3.0 x 18 mm PROMUS stent       . Cystotomy  06/14/2006    Cystotomy with open removal of the  foreign body of bladder    History  Smoking status  . Former Smoker -- 1.00 packs/day for 46 years  . Types: Cigarettes  . Quit date: 07/04/2012  Smokeless tobacco  . Not on file    History  Alcohol Use  . Yes    No family history on file.  Reviw of Systems:  Reviewed in the HPI.  All other systems are negative.  Physical Exam: BP 160/78  Pulse 51  Ht 5\' 11"  (1.803 m)  Wt 187 lb (84.823 kg)  BMI 26.09 kg/m2 The patient is alert and oriented x 3.  The mood and affect are normal.   Skin: warm and dry.  Color is normal.    HEENT:   the sclera are nonicteric.  The mucous membranes are moist.  The carotids are 2+ without bruits.  There is no thyromegaly.  There is no JVD.    Lungs: Wheezing-particularly on the right side.  The chest wall is non tender.    Heart: regular rate with a normal S1 paradoxical splitting of the S2.  There are no murmurs, gallops, or rubs. The PMI is not displaced.     Abdomen: good bowel sounds.  There is no guarding or rebound.  There is no hepatosplenomegaly or tenderness.  There are no masses.   Extremities:  no clubbing, cyanosis, or edema.  The legs are without  rashes.  The distal pulses are intact.   Neuro:  Cranial nerves II - XII are intact.  Motor and sensory functions are intact.    The gait is normal.  ECG: Oct. 14, 2014:  Sinus brady at 51, minimal voltage for LVH.   Assessment / Plan:

## 2013-02-03 NOTE — Patient Instructions (Signed)
Your physician recommends that you return for a FASTING lipid profile: TODAY  Your physician wants you to follow-up in: 1 YEAR  You will receive a reminder letter in the mail two months in advance. If you don't receive a letter, please call our office to schedule the follow-up appointment.

## 2013-02-10 ENCOUNTER — Telehealth: Payer: Self-pay | Admitting: Cardiovascular Disease

## 2013-02-10 NOTE — Telephone Encounter (Signed)
New message     Returning triage nurse's call to get lab results

## 2013-02-19 ENCOUNTER — Ambulatory Visit (HOSPITAL_COMMUNITY)
Admission: RE | Admit: 2013-02-19 | Discharge: 2013-02-19 | Disposition: A | Discharge status: Home / Self Care | Payer: Medicare Other | Source: Ambulatory Visit | Attending: Family Medicine | Admitting: Family Medicine

## 2013-02-19 ENCOUNTER — Other Ambulatory Visit (HOSPITAL_COMMUNITY): Payer: Self-pay | Admitting: Family Medicine

## 2013-02-19 DIAGNOSIS — Z87891 Personal history of nicotine dependence: Secondary | ICD-10-CM | POA: Insufficient documentation

## 2013-02-19 DIAGNOSIS — J438 Other emphysema: Secondary | ICD-10-CM | POA: Insufficient documentation

## 2013-04-30 ENCOUNTER — Other Ambulatory Visit: Payer: Self-pay | Admitting: Cardiovascular Disease

## 2013-04-30 ENCOUNTER — Other Ambulatory Visit: Payer: Self-pay | Admitting: *Deleted

## 2013-04-30 MED ORDER — ATORVASTATIN CALCIUM 40 MG PO TABS: ORAL_TABLET | ORAL | Status: DC

## 2013-07-06 ENCOUNTER — Encounter: Payer: Self-pay | Admitting: Cardiology

## 2013-07-27 ENCOUNTER — Encounter: Payer: Self-pay | Admitting: Gastroenterology

## 2013-08-12 NOTE — Telephone Encounter (Signed)
error 

## 2013-08-19 ENCOUNTER — Telehealth: Payer: Self-pay | Admitting: *Deleted

## 2013-08-19 NOTE — Telephone Encounter (Signed)
Patient was seen in the clinic for Accelerate Study on 08/18/13. Patient had abnormal white count of 26.3. Patient was being treated for bronchitis under Dr. Everette Rank. Labs were faxed to Dr. Everette Rank per Dr. Lia Foyer after review of labs. Patient also had abnormal liver enzymes and scanning labs in for Dr. Cathie Olden review.

## 2013-08-24 NOTE — Telephone Encounter (Signed)
His last WBC was normal and his LFTs were normal .  He needs to see his primary medical doctor. The labs from the [facility] study visit are not available currently on Epic.

## 2013-08-26 ENCOUNTER — Telehealth: Payer: Self-pay | Admitting: *Deleted

## 2013-08-26 NOTE — Telephone Encounter (Signed)
i will bring patient back in on 08/27/13 to repeat labs for abnormal liver panel. Patient verbalized understanding.

## 2013-08-26 NOTE — Telephone Encounter (Signed)
Spoke with patient who states he is scheduled for additional lab work tomorrow with the Accelerate study

## 2013-08-27 ENCOUNTER — Encounter: Payer: Self-pay | Admitting: Cardiovascular Disease

## 2013-08-28 ENCOUNTER — Telehealth: Payer: Self-pay | Admitting: *Deleted

## 2013-08-28 NOTE — Telephone Encounter (Signed)
I spoke to patient on the phone to let him know that the repeat labs for his liver profile looked okay and told him that Dr. Acie Fredrickson was faxed a copy of labs done on 08/27/13. Patient verbalized understanding.

## 2013-09-09 ENCOUNTER — Encounter: Payer: Self-pay | Admitting: Cardiology

## 2013-11-24 ENCOUNTER — Other Ambulatory Visit: Payer: Self-pay | Admitting: *Deleted

## 2013-11-24 MED ORDER — ATORVASTATIN CALCIUM 40 MG PO TABS: ORAL_TABLET | ORAL | Status: DC

## 2014-02-08 ENCOUNTER — Encounter: Payer: Self-pay | Admitting: Cardiovascular Disease

## 2014-02-08 ENCOUNTER — Ambulatory Visit (INDEPENDENT_AMBULATORY_CARE_PROVIDER_SITE_OTHER): Payer: Medicare Other | Admitting: Cardiovascular Disease

## 2014-02-08 VITALS — BP 132/70 | HR 60 | Ht 71.0 in | Wt 183.8 lb

## 2014-02-08 DIAGNOSIS — I2510 Atherosclerotic heart disease of native coronary artery without angina pectoris: Secondary | ICD-10-CM

## 2014-02-08 MED ORDER — CLOPIDOGREL BISULFATE 75 MG PO TABS: 75.0000 mg | ORAL_TABLET | Freq: Every day | ORAL | Status: DC

## 2014-02-08 MED ORDER — NITROGLYCERIN 0.4 MG SL SUBL: 0.4000 mg | SUBLINGUAL_TABLET | SUBLINGUAL | Status: DC | PRN

## 2014-02-08 MED ORDER — LISINOPRIL 10 MG PO TABS: 10.0000 mg | ORAL_TABLET | Freq: Every day | ORAL | Status: DC

## 2014-02-08 MED ORDER — METOPROLOL TARTRATE 25 MG PO TABS: 25.0000 mg | ORAL_TABLET | Freq: Two times a day (BID) | ORAL | Status: DC

## 2014-02-08 MED ORDER — ATORVASTATIN CALCIUM 40 MG PO TABS: ORAL_TABLET | ORAL | Status: DC

## 2014-02-08 NOTE — Patient Instructions (Signed)
Your physician recommends that you continue on your current medications as directed. Please refer to the Current Medication list given to you today.  Your physician recommends that you have lab work:  TODAY - lipid, liver, basic metabolic panel  Your physician wants you to follow-up in: 1 year with Dr. Acie Fredrickson.  You will receive a reminder letter in the mail two months in advance. If you don't receive a letter, please call our office to schedule the follow-up appointment.

## 2014-02-08 NOTE — Assessment & Plan Note (Signed)
Pt is doing well.  No CP,  Stays active.  will check lipids, liver, BMP  I will see him in 1 year

## 2014-02-08 NOTE — Progress Notes (Signed)
Garrett Mckinney Date of Birth  [birth year] [facility] [address]    [place], [place]  [address] [phone]  Fax  [phone]   Problems: 1. Coronary artery disease-status post inferior lateral wall myocardial infarction daily status post PTCA and stenting of the left circumflex artery using a 3.0 x 18 mm Promus stent. It was post dilated using a 3.5 x 15 mm Quantum apex (February, 2011) 2. Chronic total occlusion of the right coronary artery 3. Hyperlipidemia 4. Nonsustained ventricular tachycardia 5. Continued cigarette smoking  History of Present Illness: 63 year old gentleman with a history of coronary artery disease -status post stenting. He also has a history of hyperlipidemia and hypertension.  He's not had any episodes of chest pain. He has occasional episodes of shortness of breath.  He is still smoking but is committed to quitting.  He is still smoking some.  He got a filter that supposed to take the tar out of the smoke.  August 08, 2012   he has stopped smoking 6 weeks ago.  He is feeling much better.    Denies angina. Working   Oct. 14, 2014:  Garrett Mckinney is doing well.  He has not had any chest pain.  He is concerned about his grandson who recently broke his left wrist.    Oct. 19, 2015:  Garrett Mckinney is doing well.  Stays active. No dizziness, no CP    No Known Allergies  Past Medical History  Diagnosis Date  . Myocardial infarction, inferior wall 05/24/2009    Acute inferior lateral wall myocardial infarction  . 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 Hyperlipidemia   . NSVT (nonsustained ventricular tachycardia)   . Dyslipidemia   . Hypertension   . Diabetes mellitus   . Tobacco abuse     Past Surgical History  Procedure Laterality Date  . Coronary angioplasty with stent placement  05/24/2009    Est. EF is 70-75% -- stenting of the mid/distal left circumflex artery using a 3.0 x 18 mm PROMUS  stent       . Cystotomy  06/14/2006    Cystotomy with open removal of the foreign body of bladder    History  Smoking status  . Former Smoker -- 1.00 packs/day for 46 years  . Types: Cigarettes  . Quit date: 07/04/2012  Smokeless tobacco  . Not on file    History  Alcohol Use  . Yes    No family history on file.  Reviw of Systems:  Reviewed in the HPI.  All other systems are negative.  Physical Exam: BP 132/70  Pulse 60  Ht 5\' 11"  (1.803 m)  Wt 183 lb 12.8 oz (83.371 kg)  BMI 25.65 kg/m2 The patient is alert and oriented x 3.  The mood and affect are normal.   Skin: warm and dry.  Color is normal.    HEENT:   the sclera are nonicteric.  The mucous membranes are moist.  The carotids are 2+ without bruits.  There is no thyromegaly.  There is no JVD.    Lungs: Wheezing-particularly on the left .  The chest wall is non tender.    Heart: regular rate with a normal S1  S2.  There  Is a soft systolic murmur. The PMI is not displaced.     Abdomen: good bowel sounds.  There is no guarding or rebound.  There is no hepatosplenomegaly or tenderness.  There are  no masses.   Extremities:  no clubbing, cyanosis, or edema.  The legs are without rashes.  The distal pulses are intact.   Neuro:  Cranial nerves II - XII are intact.  Motor and sensory functions are intact.    The gait is normal.  ECG: Oct.  19,   2015: Normal sinus rhythm at 60. EKG is normal.  Assessment / Plan:

## 2014-02-08 NOTE — Assessment & Plan Note (Signed)
He continues to have wheezing. This time on the left side.

## 2014-02-09 LAB — LIPID PANEL
CHOL/HDL RATIO: 3
Cholesterol: 137 mg/dL (ref 0–200)
HDL: 45.3 mg/dL (ref >39.00)
LDL Cholesterol: 68 mg/dL (ref 0–99)
NONHDL: 91.7
Triglycerides: 120 mg/dL (ref 0.0–149.0)
VLDL: 24 mg/dL (ref 0.0–40.0)

## 2014-02-09 LAB — HEPATIC FUNCTION PANEL
ALT: 21 U/L (ref 0–53)
AST: 21 U/L (ref 0–37)
Albumin: 3.3 g/dL — ABNORMAL LOW (ref 3.5–5.2)
Alkaline Phosphatase: 63 U/L (ref 39–117)
Bilirubin, Direct: 0.1 mg/dL (ref 0.0–0.3)
Total Bilirubin: 0.9 mg/dL (ref 0.2–1.2)
Total Protein: 7.4 g/dL (ref 6.0–8.3)

## 2014-02-09 LAB — BASIC METABOLIC PANEL
BUN: 13 mg/dL (ref 6–23)
CHLORIDE: 105 meq/L (ref 96–112)
CO2: 29 mEq/L (ref 19–32)
CREATININE: 0.8 mg/dL (ref 0.4–1.5)
Calcium: 9.1 mg/dL (ref 8.4–10.5)
GFR: 101.47 mL/min (ref >60.00)
Glucose, Bld: 128 mg/dL — ABNORMAL HIGH (ref 70–99)
POTASSIUM: 4.1 meq/L (ref 3.5–5.1)
SODIUM: 140 meq/L (ref 135–145)

## 2014-04-12 ENCOUNTER — Encounter: Payer: Self-pay | Admitting: Cardiology

## 2014-11-18 ENCOUNTER — Other Ambulatory Visit: Payer: Self-pay | Admitting: Cardiovascular Disease

## 2014-11-23 ENCOUNTER — Ambulatory Visit (HOSPITAL_COMMUNITY)
Admission: RE | Admit: 2014-11-23 | Discharge: 2014-11-23 | Disposition: A | Discharge status: Home / Self Care | Payer: Medicare Other | Source: Ambulatory Visit | Attending: Family Medicine | Admitting: Family Medicine

## 2014-11-23 ENCOUNTER — Other Ambulatory Visit (HOSPITAL_COMMUNITY): Payer: Self-pay | Admitting: Family Medicine

## 2014-11-23 DIAGNOSIS — R519 Headache, unspecified: Secondary | ICD-10-CM

## 2014-11-23 DIAGNOSIS — R51 Headache: Secondary | ICD-10-CM | POA: Insufficient documentation

## 2014-11-23 DIAGNOSIS — R930 Abnormal findings on diagnostic imaging of skull and head, not elsewhere classified: Secondary | ICD-10-CM | POA: Diagnosis not present

## 2014-11-23 DIAGNOSIS — R110 Nausea: Secondary | ICD-10-CM | POA: Diagnosis not present

## 2014-11-24 ENCOUNTER — Ambulatory Visit (HOSPITAL_COMMUNITY)
Admission: RE | Admit: 2014-11-24 | Discharge: 2014-11-24 | Disposition: A | Discharge status: Home / Self Care | Payer: Medicare Other | Source: Ambulatory Visit | Attending: Family Medicine | Admitting: Family Medicine

## 2014-11-24 ENCOUNTER — Other Ambulatory Visit (HOSPITAL_COMMUNITY): Payer: Self-pay | Admitting: Family Medicine

## 2014-11-24 DIAGNOSIS — G9389 Other specified disorders of brain: Secondary | ICD-10-CM | POA: Insufficient documentation

## 2014-11-24 DIAGNOSIS — R51 Headache: Secondary | ICD-10-CM | POA: Diagnosis not present

## 2014-11-24 DIAGNOSIS — G44021 Chronic cluster headache, intractable: Secondary | ICD-10-CM

## 2014-12-09 ENCOUNTER — Encounter: Payer: Self-pay | Admitting: Neurology

## 2014-12-09 ENCOUNTER — Ambulatory Visit (INDEPENDENT_AMBULATORY_CARE_PROVIDER_SITE_OTHER): Payer: Medicare Other | Admitting: Neurology

## 2014-12-09 VITALS — BP 163/90 | HR 67 | Ht 71.0 in | Wt 175.5 lb

## 2014-12-09 DIAGNOSIS — R51 Headache: Secondary | ICD-10-CM

## 2014-12-09 DIAGNOSIS — Z5181 Encounter for therapeutic drug level monitoring: Secondary | ICD-10-CM

## 2014-12-09 DIAGNOSIS — R519 Headache, unspecified: Secondary | ICD-10-CM | POA: Insufficient documentation

## 2014-12-09 HISTORY — DX: Headache, unspecified: R51.9
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
## 2014-12-09 NOTE — Patient Instructions (Signed)

## 2014-12-10 ENCOUNTER — Telehealth: Payer: Self-pay

## 2014-12-10 NOTE — Telephone Encounter (Signed)
Rn receive call from [place] from [employer]. She reported patients platelets were 1,031.  Nurse who receive lab results verbalized to [facility] for [place].She will notify and verbalized this critical lab to Dr. Jannifer Franklin.

## 2014-12-13 ENCOUNTER — Telehealth: Payer: Self-pay | Admitting: Neurology

## 2014-12-13 LAB — CBC WITH DIFFERENTIAL/PLATELET
BASOS: 1 %
Basophils Absolute: 0.1 10*3/uL (ref 0.0–0.2)
EOS (ABSOLUTE): 0.3 10*3/uL (ref 0.0–0.4)
EOS: 2 %
HEMATOCRIT: 35.8 % — AB (ref 37.5–51.0)
HEMOGLOBIN: 11.9 g/dL — AB (ref 12.6–17.7)
Immature Grans (Abs): 0 10*3/uL (ref 0.0–0.1)
Immature Granulocytes: 0 %
LYMPHS ABS: 3 10*3/uL (ref 0.7–3.1)
Lymphs: 25 %
MCH: 28.5 pg (ref 26.6–33.0)
MCHC: 33.2 g/dL (ref 31.5–35.7)
MCV: 86 fL (ref 79–97)
MONOS ABS: 1.1 10*3/uL — AB (ref 0.1–0.9)
Monocytes: 9 %
NEUTROS ABS: 7.5 10*3/uL — AB (ref 1.4–7.0)
Neutrophils: 63 %
Platelets: 1031 10*3/uL (ref 150–379)
RBC: 4.18 x10E6/uL (ref 4.14–5.80)
RDW: 17.2 % — ABNORMAL HIGH (ref 12.3–15.4)
WBC: 12.1 10*3/uL — AB (ref 3.4–10.8)

## 2014-12-13 LAB — COMPREHENSIVE METABOLIC PANEL
ALBUMIN: 3.6 g/dL (ref 3.6–4.8)
ALK PHOS: 160 IU/L — AB (ref 39–117)
ALT: 69 IU/L — ABNORMAL HIGH (ref 0–44)
AST: 37 IU/L (ref 0–40)
Albumin/Globulin Ratio: 0.8 — ABNORMAL LOW (ref 1.1–2.5)
BILIRUBIN TOTAL: 0.3 mg/dL (ref 0.0–1.2)
BUN / CREAT RATIO: 14 (ref 10–22)
BUN: 13 mg/dL (ref 8–27)
CHLORIDE: 100 mmol/L (ref 97–108)
CO2: 27 mmol/L (ref 18–29)
Calcium: 9.9 mg/dL (ref 8.6–10.2)
Creatinine, Ser: 0.95 mg/dL (ref 0.76–1.27)
GFR calc non Af Amer: 83 mL/min/{1.73_m2} (ref >59)
GFR, EST AFRICAN AMERICAN: 96 mL/min/{1.73_m2} (ref >59)
GLOBULIN, TOTAL: 4.3 g/dL (ref 1.5–4.5)
Glucose: 91 mg/dL (ref 65–99)
Potassium: 5.9 mmol/L — ABNORMAL HIGH (ref 3.5–5.2)
SODIUM: 140 mmol/L (ref 134–144)
TOTAL PROTEIN: 7.9 g/dL (ref 6.0–8.5)

## 2014-12-13 LAB — ROCKY MTN SPOTTED FVR ABS PNL(IGG+IGM)
RMSF IgG: UNDETERMINED
RMSF IgM: 0.27 index (ref 0.00–0.89)

## 2014-12-13 LAB — RMSF, IGG, IFA: RMSF, IGG, IFA: <1:64 {titer}

## 2014-12-13 LAB — ANA W/REFLEX: ANA: NEGATIVE

## 2014-12-13 LAB — ANGIOTENSIN CONVERTING ENZYME

## 2014-12-13 LAB — SEDIMENTATION RATE: SED RATE: 17 mm/h (ref 0–30)

## 2014-12-13 LAB — B. BURGDORFI ANTIBODIES

## 2014-12-13 NOTE — Telephone Encounter (Signed)
I called patient. The blood work was abnormal, white blood count remains elevated, hemoglobin is low, platelets are very high. I question whether or not he needs to have a hematology referral. The patient indicates that he was seen recently by his primary care physician, and he believes that a CBC was drawn. I will fax this note over to the primary care doctor with the blood work results. If these issues appear to be evolving or new, a hematology referral may be in order.

## 2014-12-14 ENCOUNTER — Other Ambulatory Visit: Payer: Self-pay | Admitting: Cardiovascular Disease

## 2014-12-17 ENCOUNTER — Ambulatory Visit
Admission: RE | Admit: 2014-12-17 | Discharge: 2014-12-17 | Disposition: A | Discharge status: Home / Self Care | Payer: Medicare Other | Source: Ambulatory Visit | Attending: Neurology | Admitting: Neurology

## 2014-12-17 DIAGNOSIS — R51 Headache: Principal | ICD-10-CM

## 2014-12-17 DIAGNOSIS — R519 Headache, unspecified: Secondary | ICD-10-CM

## 2014-12-20 ENCOUNTER — Telehealth: Payer: Self-pay | Admitting: Neurology

## 2014-12-20 NOTE — Telephone Encounter (Signed)
I called the patient. MRA of the head is normal. He is following up with his primary MD concerning the blood work.   MRA head 12/20/14:  IMPRESSION:  Normal MRA head (without). Normal variant branch patterns noted as above.

## 2014-12-28 ENCOUNTER — Telehealth: Payer: Self-pay | Admitting: Neurology

## 2014-12-28 NOTE — Telephone Encounter (Signed)
I called the patient. The platelets are down to 477. This is much lower from what was originally seen, the patient is doing better. He indicates that his headaches have gone away.

## 2014-12-28 NOTE — Telephone Encounter (Signed)
Patient is calling and specifically asked to speak with Dr. Jannifer Franklin about his bloodwork.  Thanks!

## 2014-12-28 NOTE — Telephone Encounter (Signed)
I called the patient. He stated that his PCP rechecked his platelet count and it had come down to 477. He recommended they wait 2 weeks and check it again to see if it comes down. The patient would like to know what Dr. Jannifer Franklin' opinion is on this plan and would also like to know if maybe this change in his platelet level could have been the reason his head was hurting so bad.

## 2015-01-05 ENCOUNTER — Telehealth: Payer: Self-pay

## 2015-01-05 NOTE — Telephone Encounter (Signed)
Called and left patient a message to call me . I wanted to ask patient if he can follow up with Megan around the same time or a different day. Please send me a message when patient calls.

## 2015-01-19 ENCOUNTER — Encounter: Payer: Self-pay | Admitting: Neurology

## 2015-01-19 ENCOUNTER — Ambulatory Visit (INDEPENDENT_AMBULATORY_CARE_PROVIDER_SITE_OTHER): Payer: Medicare Other | Admitting: Neurology

## 2015-01-19 VITALS — BP 158/80 | HR 60 | Ht 71.0 in | Wt 186.0 lb

## 2015-01-19 DIAGNOSIS — R519 Headache, unspecified: Secondary | ICD-10-CM

## 2015-01-19 DIAGNOSIS — R51 Headache: Secondary | ICD-10-CM | POA: Diagnosis not present
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
## 2015-01-19 NOTE — Patient Instructions (Signed)

## 2015-02-14 ENCOUNTER — Ambulatory Visit (INDEPENDENT_AMBULATORY_CARE_PROVIDER_SITE_OTHER): Payer: Medicare Other | Admitting: Cardiovascular Disease

## 2015-02-14 ENCOUNTER — Encounter: Payer: Self-pay | Admitting: Cardiovascular Disease

## 2015-02-14 VITALS — BP 138/70 | HR 54 | Ht 71.0 in | Wt 183.8 lb

## 2015-02-14 DIAGNOSIS — I2510 Atherosclerotic heart disease of native coronary artery without angina pectoris: Secondary | ICD-10-CM | POA: Diagnosis not present

## 2015-02-14 DIAGNOSIS — E785 Hyperlipidemia, unspecified: Secondary | ICD-10-CM | POA: Insufficient documentation

## 2015-02-14 LAB — LIPID PANEL
CHOL/HDL RATIO: 3.7 ratio (ref <=5.0)
Cholesterol: 115 mg/dL — ABNORMAL LOW (ref 125–200)
HDL: 31 mg/dL — ABNORMAL LOW (ref >=40)
LDL Cholesterol: 69 mg/dL (ref <130)
Triglycerides: 73 mg/dL (ref <150)
VLDL: 15 mg/dL (ref <30)

## 2015-02-14 LAB — HEPATIC FUNCTION PANEL
ALBUMIN: 3.7 g/dL (ref 3.6–5.1)
ALK PHOS: 72 U/L (ref 40–115)
ALT: 16 U/L (ref 9–46)
AST: 18 U/L (ref 10–35)
BILIRUBIN DIRECT: 0.1 mg/dL (ref <=0.2)
BILIRUBIN TOTAL: 0.5 mg/dL (ref 0.2–1.2)
Indirect Bilirubin: 0.4 mg/dL (ref 0.2–1.2)
Total Protein: 7.3 g/dL (ref 6.1–8.1)

## 2015-02-14 LAB — BASIC METABOLIC PANEL
BUN: 14 mg/dL (ref 7–25)
CHLORIDE: 104 mmol/L (ref 98–110)
CO2: 27 mmol/L (ref 20–31)
Calcium: 9 mg/dL (ref 8.6–10.3)
Creat: 0.97 mg/dL (ref 0.70–1.25)
GLUCOSE: 121 mg/dL — AB (ref 65–99)
POTASSIUM: 4.6 mmol/L (ref 3.5–5.3)
SODIUM: 139 mmol/L (ref 135–146)

## 2015-02-14 NOTE — Patient Instructions (Signed)
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
## 2015-06-21 ENCOUNTER — Other Ambulatory Visit: Payer: Self-pay | Admitting: *Deleted

## 2015-06-21 ENCOUNTER — Telehealth: Payer: Self-pay | Admitting: Cardiovascular Disease

## 2015-06-21 DIAGNOSIS — I2510 Atherosclerotic heart disease of native coronary artery without angina pectoris: Secondary | ICD-10-CM

## 2015-06-21 MED ORDER — METOPROLOL TARTRATE 25 MG PO TABS: 25.0000 mg | ORAL_TABLET | Freq: Two times a day (BID) | ORAL | Status: DC

## 2015-06-21 NOTE — Telephone Encounter (Signed)
°*  STAT* If patient is at the pharmacy, call can be transferred to refill team.   1. Which medications need to be refilled? (please list name of each medication and dose if known) Metoprolol 25 mg-said pharmacist had not heard from you-need new prescription  2. Which pharmacy/location (including street and city if local pharmacy) is medication to be sent to?[facility]-[phone]  3. Do they need a 30 day or 90 day supply? 180 and refills

## 2016-01-06 ENCOUNTER — Other Ambulatory Visit: Payer: Self-pay | Admitting: Cardiovascular Disease

## 2016-01-23 ENCOUNTER — Other Ambulatory Visit: Payer: Self-pay | Admitting: Cardiovascular Disease

## 2016-01-24 ENCOUNTER — Other Ambulatory Visit: Payer: Self-pay

## 2016-01-24 MED ORDER — LISINOPRIL 10 MG PO TABS: 10.0000 mg | ORAL_TABLET | Freq: Every day | ORAL | 0 refills | Status: DC

## 2016-02-01 ENCOUNTER — Encounter: Payer: Self-pay | Admitting: Cardiovascular Disease

## 2016-02-14 ENCOUNTER — Encounter: Payer: Self-pay | Admitting: Cardiovascular Disease

## 2016-02-14 ENCOUNTER — Ambulatory Visit (INDEPENDENT_AMBULATORY_CARE_PROVIDER_SITE_OTHER): Payer: Medicare Other | Admitting: Cardiovascular Disease

## 2016-02-14 ENCOUNTER — Encounter (INDEPENDENT_AMBULATORY_CARE_PROVIDER_SITE_OTHER): Payer: Self-pay

## 2016-02-14 VITALS — BP 146/74 | HR 64 | Ht 71.0 in | Wt 180.6 lb

## 2016-02-14 DIAGNOSIS — E785 Hyperlipidemia, unspecified: Secondary | ICD-10-CM | POA: Diagnosis not present

## 2016-02-14 DIAGNOSIS — I2510 Atherosclerotic heart disease of native coronary artery without angina pectoris: Secondary | ICD-10-CM

## 2016-02-14 DIAGNOSIS — I10 Essential (primary) hypertension: Secondary | ICD-10-CM

## 2016-02-14 LAB — LIPID PANEL
CHOL/HDL RATIO: 3 ratio (ref <=5.0)
CHOLESTEROL: 131 mg/dL (ref 125–200)
HDL: 43 mg/dL (ref >=40)
LDL Cholesterol: 75 mg/dL (ref <130)
Triglycerides: 67 mg/dL (ref <150)
VLDL: 13 mg/dL (ref <30)

## 2016-02-14 LAB — COMPREHENSIVE METABOLIC PANEL
ALT: 16 U/L (ref 9–46)
AST: 18 U/L (ref 10–35)
Albumin: 3.9 g/dL (ref 3.6–5.1)
Alkaline Phosphatase: 68 U/L (ref 40–115)
BILIRUBIN TOTAL: 0.6 mg/dL (ref 0.2–1.2)
BUN: 10 mg/dL (ref 7–25)
CALCIUM: 9.4 mg/dL (ref 8.6–10.3)
CHLORIDE: 103 mmol/L (ref 98–110)
CO2: 30 mmol/L (ref 20–31)
Creat: 0.9 mg/dL (ref 0.70–1.25)
GLUCOSE: 115 mg/dL — AB (ref 65–99)
Potassium: 5 mmol/L (ref 3.5–5.3)
Sodium: 139 mmol/L (ref 135–146)
Total Protein: 7.2 g/dL (ref 6.1–8.1)

## 2016-02-14 MED ORDER — METOPROLOL TARTRATE 25 MG PO TABS: 25.0000 mg | ORAL_TABLET | Freq: Two times a day (BID) | ORAL | 3 refills | Status: DC

## 2016-02-14 MED ORDER — LISINOPRIL 10 MG PO TABS: 10.0000 mg | ORAL_TABLET | Freq: Every day | ORAL | 3 refills | Status: DC

## 2016-02-14 MED ORDER — CLOPIDOGREL BISULFATE 75 MG PO TABS: 75.0000 mg | ORAL_TABLET | Freq: Every day | ORAL | 3 refills | Status: DC

## 2016-02-14 MED ORDER — ATORVASTATIN CALCIUM 40 MG PO TABS: 40.0000 mg | ORAL_TABLET | Freq: Every day | ORAL | 3 refills | Status: DC

## 2016-02-14 MED ORDER — NITROGLYCERIN 0.4 MG SL SUBL: 0.4000 mg | SUBLINGUAL_TABLET | SUBLINGUAL | 6 refills | Status: DC | PRN
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
## 2016-02-14 NOTE — Patient Instructions (Signed)

## 2016-03-09 ENCOUNTER — Telehealth: Payer: Self-pay | Admitting: Cardiovascular Disease

## 2016-03-09 NOTE — Telephone Encounter (Signed)
New message   Pt verbalized that he wants a call back from the rn   He did not want to disclose any information to the call

## 2016-03-09 NOTE — Telephone Encounter (Signed)
Spoke with patient who called to review lab results.  I reviewed this information with him and he thanked me for the call.

## 2016-11-16 ENCOUNTER — Other Ambulatory Visit: Payer: Self-pay | Admitting: Cardiovascular Disease

## 2016-12-22 ENCOUNTER — Other Ambulatory Visit: Payer: Self-pay | Admitting: Cardiovascular Disease

## 2016-12-22 DIAGNOSIS — I2510 Atherosclerotic heart disease of native coronary artery without angina pectoris: Secondary | ICD-10-CM
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
## 2017-02-12 ENCOUNTER — Other Ambulatory Visit: Payer: Self-pay | Admitting: *Deleted

## 2017-02-12 MED ORDER — ATORVASTATIN CALCIUM 40 MG PO TABS: 40.0000 mg | ORAL_TABLET | Freq: Every day | ORAL | 0 refills | Status: DC

## 2017-02-28 ENCOUNTER — Other Ambulatory Visit: Payer: Self-pay | Admitting: Cardiovascular Disease

## 2017-04-02 ENCOUNTER — Other Ambulatory Visit: Payer: Self-pay | Admitting: Cardiovascular Disease

## 2017-04-02 DIAGNOSIS — I2510 Atherosclerotic heart disease of native coronary artery without angina pectoris: Secondary | ICD-10-CM

## 2017-04-12 DIAGNOSIS — S0990XA Unspecified injury of head, initial encounter: Secondary | ICD-10-CM | POA: Insufficient documentation

## 2017-04-12 DIAGNOSIS — I240 Acute coronary thrombosis not resulting in myocardial infarction: Secondary | ICD-10-CM | POA: Insufficient documentation

## 2017-04-12 DIAGNOSIS — Z720 Tobacco use: Secondary | ICD-10-CM | POA: Insufficient documentation

## 2017-04-12 DIAGNOSIS — E785 Hyperlipidemia, unspecified: Secondary | ICD-10-CM | POA: Insufficient documentation

## 2017-04-12 DIAGNOSIS — I472 Ventricular tachycardia: Secondary | ICD-10-CM | POA: Insufficient documentation

## 2017-04-12 DIAGNOSIS — I10 Essential (primary) hypertension: Secondary | ICD-10-CM | POA: Insufficient documentation

## 2017-04-12 DIAGNOSIS — I4729 Other ventricular tachycardia: Secondary | ICD-10-CM | POA: Insufficient documentation

## 2017-04-30 ENCOUNTER — Ambulatory Visit: Payer: Medicare Other | Admitting: Cardiovascular Disease

## 2017-04-30 ENCOUNTER — Encounter (INDEPENDENT_AMBULATORY_CARE_PROVIDER_SITE_OTHER): Payer: Self-pay

## 2017-04-30 ENCOUNTER — Encounter: Payer: Self-pay | Admitting: Cardiovascular Disease

## 2017-04-30 VITALS — BP 108/62 | HR 63 | Ht 71.0 in | Wt 177.4 lb

## 2017-04-30 DIAGNOSIS — I2510 Atherosclerotic heart disease of native coronary artery without angina pectoris: Secondary | ICD-10-CM | POA: Diagnosis not present

## 2017-04-30 DIAGNOSIS — E782 Mixed hyperlipidemia: Secondary | ICD-10-CM | POA: Diagnosis not present

## 2017-04-30 MED ORDER — METOPROLOL TARTRATE 25 MG PO TABS: 25.0000 mg | ORAL_TABLET | Freq: Two times a day (BID) | ORAL | 3 refills | Status: DC

## 2017-04-30 MED ORDER — ATORVASTATIN CALCIUM 40 MG PO TABS
40.0000 mg | ORAL_TABLET | Freq: Every day | ORAL | 3 refills | Status: DC
Start: 2017-04-30 — End: 2018-02-07

## 2017-04-30 MED ORDER — LISINOPRIL 10 MG PO TABS: 10.0000 mg | ORAL_TABLET | Freq: Every day | ORAL | 3 refills | Status: DC

## 2017-04-30 MED ORDER — CLOPIDOGREL BISULFATE 75 MG PO TABS: 75.0000 mg | ORAL_TABLET | Freq: Every day | ORAL | 3 refills | Status: DC
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
## 2017-04-30 NOTE — Patient Instructions (Signed)

## 2017-08-22 ENCOUNTER — Other Ambulatory Visit: Payer: Self-pay | Admitting: Cardiovascular Disease

## 2017-08-22 DIAGNOSIS — I2510 Atherosclerotic heart disease of native coronary artery without angina pectoris: Secondary | ICD-10-CM

## 2017-10-07 DIAGNOSIS — Z125 Encounter for screening for malignant neoplasm of prostate: Secondary | ICD-10-CM | POA: Diagnosis not present

## 2017-10-07 DIAGNOSIS — I10 Essential (primary) hypertension: Secondary | ICD-10-CM | POA: Diagnosis not present

## 2017-10-08 DIAGNOSIS — Z0000 Encounter for general adult medical examination without abnormal findings: Secondary | ICD-10-CM | POA: Diagnosis not present

## 2018-01-09 ENCOUNTER — Ambulatory Visit (HOSPITAL_COMMUNITY)
Admission: RE | Admit: 2018-01-09 | Discharge: 2018-01-09 | Disposition: A | Discharge status: Home / Self Care | Payer: Medicare Other | Source: Ambulatory Visit | Attending: Adult Health | Admitting: Adult Health

## 2018-01-09 ENCOUNTER — Other Ambulatory Visit (HOSPITAL_COMMUNITY): Payer: Self-pay | Admitting: Adult Health

## 2018-01-09 DIAGNOSIS — N50819 Testicular pain, unspecified: Secondary | ICD-10-CM | POA: Insufficient documentation

## 2018-01-09 DIAGNOSIS — N433 Hydrocele, unspecified: Secondary | ICD-10-CM | POA: Insufficient documentation

## 2018-01-09 DIAGNOSIS — M25571 Pain in right ankle and joints of right foot: Secondary | ICD-10-CM | POA: Diagnosis not present

## 2018-01-09 DIAGNOSIS — N509 Disorder of male genital organs, unspecified: Secondary | ICD-10-CM | POA: Insufficient documentation

## 2018-01-16 ENCOUNTER — Other Ambulatory Visit (HOSPITAL_COMMUNITY): Payer: Self-pay | Admitting: Urology

## 2018-01-16 DIAGNOSIS — N452 Orchitis: Secondary | ICD-10-CM | POA: Diagnosis not present

## 2018-01-16 DIAGNOSIS — R3912 Poor urinary stream: Secondary | ICD-10-CM | POA: Diagnosis not present

## 2018-01-16 DIAGNOSIS — N451 Epididymitis: Secondary | ICD-10-CM | POA: Diagnosis not present

## 2018-02-07 ENCOUNTER — Other Ambulatory Visit: Payer: Self-pay | Admitting: Cardiovascular Disease

## 2018-02-26 ENCOUNTER — Ambulatory Visit (HOSPITAL_COMMUNITY)
Admission: RE | Admit: 2018-02-26 | Discharge: 2018-02-26 | Disposition: A | Discharge status: Home / Self Care | Payer: Medicare Other | Source: Ambulatory Visit | Attending: Urology | Admitting: Urology

## 2018-02-26 DIAGNOSIS — N452 Orchitis: Secondary | ICD-10-CM | POA: Diagnosis not present

## 2018-03-13 ENCOUNTER — Other Ambulatory Visit (HOSPITAL_COMMUNITY): Payer: Self-pay | Admitting: Urology

## 2018-03-13 DIAGNOSIS — N452 Orchitis: Secondary | ICD-10-CM | POA: Diagnosis not present

## 2018-04-04 ENCOUNTER — Encounter: Payer: Self-pay | Admitting: Cardiovascular Disease

## 2018-04-04 DIAGNOSIS — I10 Essential (primary) hypertension: Secondary | ICD-10-CM | POA: Diagnosis not present

## 2018-04-04 DIAGNOSIS — E7800 Pure hypercholesterolemia, unspecified: Secondary | ICD-10-CM | POA: Diagnosis not present

## 2018-04-04 DIAGNOSIS — E119 Type 2 diabetes mellitus without complications: Secondary | ICD-10-CM | POA: Diagnosis not present

## 2018-04-08 DIAGNOSIS — J4520 Mild intermittent asthma, uncomplicated: Secondary | ICD-10-CM | POA: Diagnosis not present

## 2018-04-08 DIAGNOSIS — Z23 Encounter for immunization: Secondary | ICD-10-CM | POA: Diagnosis not present

## 2018-04-08 DIAGNOSIS — E118 Type 2 diabetes mellitus with unspecified complications: Secondary | ICD-10-CM | POA: Diagnosis not present

## 2018-04-08 DIAGNOSIS — E7800 Pure hypercholesterolemia, unspecified: Secondary | ICD-10-CM | POA: Diagnosis not present

## 2018-04-08 DIAGNOSIS — Z79899 Other long term (current) drug therapy: Secondary | ICD-10-CM | POA: Diagnosis not present

## 2018-04-08 DIAGNOSIS — I10 Essential (primary) hypertension: Secondary | ICD-10-CM | POA: Diagnosis not present

## 2018-04-30 ENCOUNTER — Ambulatory Visit: Payer: Medicare Other | Admitting: Cardiovascular Disease

## 2018-04-30 ENCOUNTER — Encounter (INDEPENDENT_AMBULATORY_CARE_PROVIDER_SITE_OTHER): Payer: Self-pay

## 2018-04-30 ENCOUNTER — Encounter: Payer: Self-pay | Admitting: Cardiovascular Disease

## 2018-04-30 VITALS — BP 138/72 | HR 58 | Ht 71.0 in | Wt 174.8 lb

## 2018-04-30 DIAGNOSIS — I2510 Atherosclerotic heart disease of native coronary artery without angina pectoris: Secondary | ICD-10-CM

## 2018-04-30 DIAGNOSIS — R0609 Other forms of dyspnea: Secondary | ICD-10-CM

## 2018-04-30 LAB — LIPID PANEL
Chol/HDL Ratio: 2.5 ratio (ref 0.0–5.0)
Cholesterol, Total: 108 mg/dL (ref 100–199)
HDL: 43 mg/dL (ref >39)
LDL Calculated: 55 mg/dL (ref 0–99)
TRIGLYCERIDES: 49 mg/dL (ref 0–149)
VLDL Cholesterol Cal: 10 mg/dL (ref 5–40)

## 2018-04-30 LAB — HEPATIC FUNCTION PANEL
ALT: 12 IU/L (ref 0–44)
AST: 16 IU/L (ref 0–40)
Albumin: 4.1 g/dL (ref 3.6–4.8)
Alkaline Phosphatase: 69 IU/L (ref 39–117)
BILIRUBIN TOTAL: 0.4 mg/dL (ref 0.0–1.2)
BILIRUBIN, DIRECT: 0.15 mg/dL (ref 0.00–0.40)
Total Protein: 7 g/dL (ref 6.0–8.5)

## 2018-04-30 LAB — BASIC METABOLIC PANEL
BUN/Creatinine Ratio: 13 (ref 10–24)
BUN: 13 mg/dL (ref 8–27)
CHLORIDE: 100 mmol/L (ref 96–106)
CO2: 26 mmol/L (ref 20–29)
Calcium: 9.5 mg/dL (ref 8.6–10.2)
Creatinine, Ser: 1.01 mg/dL (ref 0.76–1.27)
GFR calc Af Amer: 87 mL/min/{1.73_m2} (ref >59)
GFR calc non Af Amer: 76 mL/min/{1.73_m2} (ref >59)
GLUCOSE: 102 mg/dL — AB (ref 65–99)
POTASSIUM: 5.6 mmol/L — AB (ref 3.5–5.2)
SODIUM: 138 mmol/L (ref 134–144)
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
## 2018-04-30 NOTE — Patient Instructions (Signed)
Medication Instructions:  Your provider recommends that you continue on your current medications as directed. Please refer to the Current Medication list given to you today.    Labwork: TODAY: BMET, LFTs, Lipids  Testing/Procedures: Your physician has requested that you have an echocardiogram. Echocardiography is a painless test that uses sound waves to create images of your heart. It provides your doctor with information about the size and shape of your heart and how well your heart's chambers and valves are working. This procedure takes approximately one hour. There are no restrictions for this procedure. Your echocardiogram is scheduled Monday, May 05, 2018. Please arrive by 12:30PM.   Follow-Up: Your provider wants you to follow-up in: 1 year with Dr. Acie Fredrickson. You will receive a reminder letter in the mail two months in advance. If you don't receive a letter, please call our office to schedule the follow-up appointment.    Any Other Special Instructions Will Be Listed Below (If Applicable).     If you need a refill on your cardiac medications before your next appointment, please call your pharmacy.

## 2018-05-05 ENCOUNTER — Ambulatory Visit (HOSPITAL_COMMUNITY): Payer: Medicare Other | Attending: Cardiovascular Disease

## 2018-05-05 ENCOUNTER — Other Ambulatory Visit: Payer: Self-pay

## 2018-05-05 DIAGNOSIS — I2510 Atherosclerotic heart disease of native coronary artery without angina pectoris: Secondary | ICD-10-CM

## 2018-05-05 DIAGNOSIS — R0609 Other forms of dyspnea: Secondary | ICD-10-CM

## 2018-05-06 ENCOUNTER — Telehealth: Payer: Self-pay | Admitting: Cardiovascular Disease

## 2018-05-06 DIAGNOSIS — J449 Chronic obstructive pulmonary disease, unspecified: Secondary | ICD-10-CM

## 2018-05-06 DIAGNOSIS — R0609 Other forms of dyspnea: Principal | ICD-10-CM

## 2018-05-06 NOTE — Telephone Encounter (Signed)
Patient returned call for Echo results.  ?

## 2018-05-06 NOTE — Telephone Encounter (Signed)
Reviewed echo results with patient and advised that we will refer him to pulmonology for evaluation of COPD. I advised him that follow-up with cardiology will be in 1 year unless needed sooner after pulmonary evaluation. He verbalized understanding and agreement and thanked me for the call.

## 2018-05-13 ENCOUNTER — Encounter (HOSPITAL_COMMUNITY): Payer: Self-pay | Admitting: Hematology

## 2018-05-13 ENCOUNTER — Inpatient Hospital Stay (HOSPITAL_COMMUNITY): Payer: Medicare Other | Attending: Hematology | Admitting: Hematology

## 2018-05-13 ENCOUNTER — Inpatient Hospital Stay (HOSPITAL_COMMUNITY): Payer: Medicare Other

## 2018-05-13 ENCOUNTER — Other Ambulatory Visit: Payer: Self-pay

## 2018-05-13 VITALS — BP 143/72 | HR 64 | Temp 97.7°F | Resp 16 | Ht 71.0 in | Wt 178.4 lb

## 2018-05-13 DIAGNOSIS — J449 Chronic obstructive pulmonary disease, unspecified: Secondary | ICD-10-CM | POA: Diagnosis not present

## 2018-05-13 DIAGNOSIS — E119 Type 2 diabetes mellitus without complications: Secondary | ICD-10-CM

## 2018-05-13 DIAGNOSIS — Z87891 Personal history of nicotine dependence: Secondary | ICD-10-CM

## 2018-05-13 DIAGNOSIS — Z7984 Long term (current) use of oral hypoglycemic drugs: Secondary | ICD-10-CM | POA: Insufficient documentation

## 2018-05-13 DIAGNOSIS — Z9081 Acquired absence of spleen: Secondary | ICD-10-CM | POA: Diagnosis not present

## 2018-05-13 DIAGNOSIS — R7989 Other specified abnormal findings of blood chemistry: Secondary | ICD-10-CM

## 2018-05-13 DIAGNOSIS — Z122 Encounter for screening for malignant neoplasm of respiratory organs: Secondary | ICD-10-CM

## 2018-05-13 DIAGNOSIS — Z808 Family history of malignant neoplasm of other organs or systems: Secondary | ICD-10-CM | POA: Diagnosis not present

## 2018-05-13 DIAGNOSIS — D473 Essential (hemorrhagic) thrombocythemia: Secondary | ICD-10-CM | POA: Diagnosis not present

## 2018-05-13 DIAGNOSIS — Z79899 Other long term (current) drug therapy: Secondary | ICD-10-CM | POA: Diagnosis not present

## 2018-05-13 DIAGNOSIS — M25569 Pain in unspecified knee: Secondary | ICD-10-CM | POA: Diagnosis not present

## 2018-05-13 DIAGNOSIS — M545 Low back pain: Secondary | ICD-10-CM

## 2018-05-13 DIAGNOSIS — G8929 Other chronic pain: Secondary | ICD-10-CM | POA: Diagnosis not present

## 2018-05-13 DIAGNOSIS — D75838 Other thrombocytosis: Secondary | ICD-10-CM

## 2018-05-13 LAB — FOLATE: FOLATE: 31.4 ng/mL (ref >5.9)

## 2018-05-13 LAB — COMPREHENSIVE METABOLIC PANEL
ALBUMIN: 3.7 g/dL (ref 3.5–5.0)
ALT: 17 U/L (ref 0–44)
ANION GAP: 7 (ref 5–15)
AST: 20 U/L (ref 15–41)
Alkaline Phosphatase: 56 U/L (ref 38–126)
BILIRUBIN TOTAL: 0.6 mg/dL (ref 0.3–1.2)
BUN: 13 mg/dL (ref 8–23)
CO2: 30 mmol/L (ref 22–32)
Calcium: 9.1 mg/dL (ref 8.9–10.3)
Chloride: 102 mmol/L (ref 98–111)
Creatinine, Ser: 0.84 mg/dL (ref 0.61–1.24)
GFR calc Af Amer: >60 mL/min (ref >60)
GFR calc non Af Amer: >60 mL/min (ref >60)
GLUCOSE: 100 mg/dL — AB (ref 70–99)
POTASSIUM: 4.7 mmol/L (ref 3.5–5.1)
Sodium: 139 mmol/L (ref 135–145)
TOTAL PROTEIN: 7.3 g/dL (ref 6.5–8.1)

## 2018-05-13 LAB — CBC WITH DIFFERENTIAL/PLATELET
Abs Immature Granulocytes: 0.02 10*3/uL (ref 0.00–0.07)
BASOS ABS: 0.1 10*3/uL (ref 0.0–0.1)
Basophils Relative: 1 %
EOS ABS: 0.5 10*3/uL (ref 0.0–0.5)
Eosinophils Relative: 5 %
HEMATOCRIT: 43.5 % (ref 39.0–52.0)
HEMOGLOBIN: 13.5 g/dL (ref 13.0–17.0)
IMMATURE GRANULOCYTES: 0 %
Lymphocytes Relative: 24 %
Lymphs Abs: 2.2 10*3/uL (ref 0.7–4.0)
MCH: 28.8 pg (ref 26.0–34.0)
MCHC: 31 g/dL (ref 30.0–36.0)
MCV: 92.8 fL (ref 80.0–100.0)
Monocytes Absolute: 0.8 10*3/uL (ref 0.1–1.0)
Monocytes Relative: 9 %
NEUTROS ABS: 5.6 10*3/uL (ref 1.7–7.7)
NEUTROS PCT: 61 %
Platelets: 355 10*3/uL (ref 150–400)
RBC: 4.69 MIL/uL (ref 4.22–5.81)
RDW: 15.7 % — AB (ref 11.5–15.5)
WBC: 9.2 10*3/uL (ref 4.0–10.5)
nRBC: 0 % (ref 0.0–0.2)

## 2018-05-13 LAB — RETICULOCYTES
IMMATURE RETIC FRACT: 7.7 % (ref 2.3–15.9)
RBC.: 4.69 MIL/uL (ref 4.22–5.81)
RETIC CT PCT: 0.7 % (ref 0.4–3.1)
Retic Count, Absolute: 33.8 10*3/uL (ref 19.0–186.0)

## 2018-05-13 LAB — FERRITIN: Ferritin: 10 ng/mL — ABNORMAL LOW (ref 24–336)

## 2018-05-13 LAB — LACTATE DEHYDROGENASE: LDH: 98 U/L (ref 98–192)

## 2018-05-13 LAB — IRON AND TIBC
Iron: 47 ug/dL (ref 45–182)
SATURATION RATIOS: 12 % — AB (ref 17.9–39.5)
TIBC: 396 ug/dL (ref 250–450)
UIBC: 349 ug/dL

## 2018-05-13 LAB — SEDIMENTATION RATE: SED RATE: 3 mm/h (ref 0–16)

## 2018-05-13 LAB — VITAMIN B12: Vitamin B-12: 399 pg/mL (ref 180–914)

## 2018-05-13 LAB — C-REACTIVE PROTEIN
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
## 2018-05-13 NOTE — Patient Instructions (Signed)
[facility] at [facility] Discharge Instructions     Thank you for choosing [facility] at [facility] to provide your oncology and hematology care.  To afford each patient quality time with our provider, please arrive at least 15 minutes before your scheduled appointment time.   If you have a lab appointment with the Cancer Center please come in thru the  Main Entrance and check in at the main information desk  You need to re-schedule your appointment should you arrive 10 or more minutes late.  We strive to give you quality time with our providers, and arriving late affects you and other patients whose appointments are after yours.  Also, if you no show three or more times for appointments you may be dismissed from the clinic at the providers discretion.     Again, thank you for choosing [facility].  Our hope is that these requests will decrease the amount of time that you wait before being seen by our physicians.       _____________________________________________________________  Should you have questions after your visit to [facility], please contact our office at [phone] between the hours of 8:00 a.m. and 4:30 p.m.  Voicemails left after 4:00 p.m. will not be returned until the following business day.  For prescription refill requests, have your pharmacy contact our office and allow 72 hours.    Cancer Center Support Programs:   > Cancer Support Group  2nd Tuesday of the month 1pm-2pm, Journey Room    

## 2018-05-14 LAB — HEPATITIS PANEL, ACUTE
HCV AB: 0.2 {s_co_ratio} (ref 0.0–0.9)
HEP A IGM: NEGATIVE
Hep B C IgM: NEGATIVE
Hepatitis B Surface Ag: NEGATIVE

## 2018-05-21 LAB — CALR + JAK2 E12-15 + MPL (REFLEXED)

## 2018-05-21 LAB — JAK2 V617F, W REFLEX TO CALR/E12/MPL

## 2018-05-28 ENCOUNTER — Ambulatory Visit (HOSPITAL_COMMUNITY)
Admission: RE | Admit: 2018-05-28 | Discharge: 2018-05-28 | Disposition: A | Discharge status: Home / Self Care | Payer: Medicare Other | Source: Ambulatory Visit | Attending: Nurse Practitioner | Admitting: Nurse Practitioner

## 2018-05-28 DIAGNOSIS — Z87891 Personal history of nicotine dependence: Secondary | ICD-10-CM | POA: Diagnosis not present

## 2018-05-28 DIAGNOSIS — Z122 Encounter for screening for malignant neoplasm of respiratory organs: Secondary | ICD-10-CM | POA: Diagnosis not present

## 2018-05-29 DIAGNOSIS — J309 Allergic rhinitis, unspecified: Secondary | ICD-10-CM | POA: Diagnosis not present

## 2018-05-29 DIAGNOSIS — J209 Acute bronchitis, unspecified: Secondary | ICD-10-CM | POA: Diagnosis not present

## 2018-05-29 DIAGNOSIS — R05 Cough: Secondary | ICD-10-CM | POA: Diagnosis not present

## 2018-05-29 DIAGNOSIS — J0100 Acute maxillary sinusitis, unspecified: Secondary | ICD-10-CM | POA: Diagnosis not present

## 2018-06-02 ENCOUNTER — Ambulatory Visit (HOSPITAL_COMMUNITY)
Admission: RE | Admit: 2018-06-02 | Discharge: 2018-06-02 | Disposition: A | Discharge status: Home / Self Care | Payer: Medicare Other | Source: Ambulatory Visit | Attending: Urology | Admitting: Urology

## 2018-06-02 DIAGNOSIS — N452 Orchitis: Secondary | ICD-10-CM | POA: Diagnosis not present

## 2018-06-04 ENCOUNTER — Inpatient Hospital Stay (HOSPITAL_COMMUNITY): Payer: Medicare Other | Attending: Hematology | Admitting: Hematology

## 2018-06-04 ENCOUNTER — Encounter (HOSPITAL_COMMUNITY): Payer: Self-pay | Admitting: Hematology

## 2018-06-04 VITALS — BP 155/72 | HR 69 | Temp 97.7°F | Resp 18 | Wt 176.4 lb

## 2018-06-04 DIAGNOSIS — Z9081 Acquired absence of spleen: Secondary | ICD-10-CM | POA: Diagnosis not present

## 2018-06-04 DIAGNOSIS — R918 Other nonspecific abnormal finding of lung field: Secondary | ICD-10-CM | POA: Diagnosis not present

## 2018-06-04 DIAGNOSIS — Z87891 Personal history of nicotine dependence: Secondary | ICD-10-CM | POA: Diagnosis not present

## 2018-06-04 DIAGNOSIS — R7989 Other specified abnormal findings of blood chemistry: Secondary | ICD-10-CM

## 2018-06-04 DIAGNOSIS — D473 Essential (hemorrhagic) thrombocythemia: Secondary | ICD-10-CM | POA: Diagnosis not present

## 2018-06-04 DIAGNOSIS — D75838 Other thrombocytosis: Secondary | ICD-10-CM

## 2018-06-04 NOTE — Patient Instructions (Addendum)
[place] at [facility] Discharge Instructions  Please start taking over the counter Iron supplement 324mg  daily.  You may need to take a stool softener with this because it can cause constipation.    Thank you for choosing [place] at [facility] to provide your oncology and hematology care.  To afford each patient quality time with our provider, please arrive at least 15 minutes before your scheduled appointment time.   If you have a lab appointment with the [facility] please come in thru the  Main Entrance and check in at the main information desk  You need to re-schedule your appointment should you arrive 10 or more minutes late.  We strive to give you quality time with our providers, and arriving late affects you and other patients whose appointments are after yours.  Also, if you no show three or more times for appointments you may be dismissed from the clinic at the providers discretion.     Again, thank you for choosing [facility].  Our hope is that these requests will decrease the amount of time that you wait before being seen by our physicians.       _____________________________________________________________  Should you have questions after your visit to [facility], please contact our office at [phone] between the hours of 8:00 a.m. and 4:30 p.m.  Voicemails left after 4:00 p.m. will not be returned until the following business day.  For prescription refill requests, have your pharmacy contact our office and allow 72 hours.    Cancer Center Support Programs:   > Cancer Support Group  2nd Tuesday of the month 1pm-2pm, Journey Room
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
## 2018-06-10 ENCOUNTER — Encounter: Payer: Self-pay | Admitting: Pulmonary Disease

## 2018-06-10 ENCOUNTER — Ambulatory Visit: Payer: Medicare Other | Admitting: Pulmonary Disease

## 2018-06-10 VITALS — BP 126/68 | HR 69 | Ht 71.0 in | Wt 174.8 lb

## 2018-06-10 DIAGNOSIS — R0609 Other forms of dyspnea: Secondary | ICD-10-CM

## 2018-06-10 DIAGNOSIS — R911 Solitary pulmonary nodule: Secondary | ICD-10-CM

## 2018-06-10 DIAGNOSIS — J920 Pleural plaque with presence of asbestos: Secondary | ICD-10-CM

## 2018-06-10 DIAGNOSIS — J432 Centrilobular emphysema: Secondary | ICD-10-CM

## 2018-06-10 DIAGNOSIS — I272 Pulmonary hypertension, unspecified: Secondary | ICD-10-CM

## 2018-06-10 MED ORDER — UMECLIDINIUM-VILANTEROL 62.5-25 MCG/INH IN AEPB: 1.0000 | INHALATION_SPRAY | Freq: Every day | RESPIRATORY_TRACT | 0 refills | Status: DC
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
## 2018-06-10 NOTE — Patient Instructions (Addendum)
Thank you for visiting Dr. Valeta Harms at [facility]. Today we recommend the following:  Orders Placed This Encounter  Procedures  . CT CHEST HIGH RESOLUTION  . Pulmonary Function Test   Meds ordered this encounter  Medications  . umeclidinium-vilanterol (ANORO ELLIPTA) 62.5-25 MCG/INH AEPB    Sig: Inhale 1 puff into the lungs daily.    Dispense:  1 each    Refill:  0   Return in about 4 weeks (around 07/08/2018). With Wyn Quaker, NP to review PFTs and inhaler regimen.   Follow up visit with me in 6 months after HRCT of CHEST.

## 2018-06-18 ENCOUNTER — Other Ambulatory Visit: Payer: Self-pay | Admitting: Cardiovascular Disease

## 2018-06-18 DIAGNOSIS — I2510 Atherosclerotic heart disease of native coronary artery without angina pectoris: Secondary | ICD-10-CM

## 2018-06-26 ENCOUNTER — Telehealth: Payer: Self-pay | Admitting: Pulmonary Disease

## 2018-06-26 MED ORDER — UMECLIDINIUM-VILANTEROL 62.5-25 MCG/INH IN AEPB: 1.0000 | INHALATION_SPRAY | Freq: Every day | RESPIRATORY_TRACT | 3 refills | Status: DC

## 2018-06-26 NOTE — Telephone Encounter (Signed)
Called and spoke with Patient.  Patient stated that he has seen a big improvement since starting Anoro.  Patient stated that he has no wheezing, no cough, and decreased SHOB. Patient request prescription to be sent to [facility]. Prescription sent to requested pharmacy.  Nothing further at this time.  Per Dr Valeta Harms, 06/10/18 think we should complete the following: We will start the patient on Anoro Ellipta to see if this helps improves the dyspnea on exertion.

## 2018-06-27 ENCOUNTER — Other Ambulatory Visit: Payer: Self-pay | Admitting: *Deleted

## 2018-06-27 ENCOUNTER — Telehealth: Payer: Self-pay | Admitting: Pulmonary Disease

## 2018-06-27 MED ORDER — UMECLIDINIUM-VILANTEROL 62.5-25 MCG/INH IN AEPB: 1.0000 | INHALATION_SPRAY | Freq: Every day | RESPIRATORY_TRACT | 3 refills | Status: DC

## 2018-06-27 NOTE — Telephone Encounter (Signed)
Anoro prescription sent to requested pharmacy.  Patient notified of prescription being sent to pharmacy.   Nothing further at this time. Per Dr Valeta Harms, 06/10/18 think we should complete the following: We will start the patient on Anoro Ellipta to see if this helps improves the dyspnea on exertion.

## 2018-07-07 DIAGNOSIS — R748 Abnormal levels of other serum enzymes: Secondary | ICD-10-CM | POA: Diagnosis not present

## 2018-07-07 DIAGNOSIS — E119 Type 2 diabetes mellitus without complications: Secondary | ICD-10-CM | POA: Diagnosis not present

## 2018-07-07 DIAGNOSIS — E7800 Pure hypercholesterolemia, unspecified: Secondary | ICD-10-CM | POA: Diagnosis not present

## 2018-07-07 DIAGNOSIS — I10 Essential (primary) hypertension: Secondary | ICD-10-CM | POA: Diagnosis not present

## 2018-07-08 DIAGNOSIS — I10 Essential (primary) hypertension: Secondary | ICD-10-CM | POA: Diagnosis not present

## 2018-07-08 DIAGNOSIS — E118 Type 2 diabetes mellitus with unspecified complications: Secondary | ICD-10-CM | POA: Diagnosis not present

## 2018-07-08 DIAGNOSIS — E7800 Pure hypercholesterolemia, unspecified: Secondary | ICD-10-CM | POA: Diagnosis not present

## 2018-07-08 DIAGNOSIS — Z7984 Long term (current) use of oral hypoglycemic drugs: Secondary | ICD-10-CM | POA: Diagnosis not present

## 2018-07-08 DIAGNOSIS — Z79899 Other long term (current) drug therapy: Secondary | ICD-10-CM | POA: Diagnosis not present

## 2018-07-18 DIAGNOSIS — N452 Orchitis: Secondary | ICD-10-CM | POA: Diagnosis not present

## 2018-07-18 DIAGNOSIS — R3912 Poor urinary stream: Secondary | ICD-10-CM | POA: Diagnosis not present

## 2018-09-23 ENCOUNTER — Other Ambulatory Visit: Payer: Self-pay

## 2018-09-23 ENCOUNTER — Inpatient Hospital Stay (HOSPITAL_COMMUNITY): Payer: Medicare Other | Attending: Hematology

## 2018-09-23 DIAGNOSIS — Z87891 Personal history of nicotine dependence: Secondary | ICD-10-CM | POA: Insufficient documentation

## 2018-09-23 DIAGNOSIS — R0602 Shortness of breath: Secondary | ICD-10-CM | POA: Insufficient documentation

## 2018-09-23 DIAGNOSIS — Z9081 Acquired absence of spleen: Secondary | ICD-10-CM

## 2018-09-23 DIAGNOSIS — M25473 Effusion, unspecified ankle: Secondary | ICD-10-CM | POA: Diagnosis not present

## 2018-09-23 DIAGNOSIS — D75838 Other thrombocytosis: Secondary | ICD-10-CM

## 2018-09-23 DIAGNOSIS — R911 Solitary pulmonary nodule: Secondary | ICD-10-CM | POA: Diagnosis not present

## 2018-09-23 DIAGNOSIS — D473 Essential (hemorrhagic) thrombocythemia: Secondary | ICD-10-CM | POA: Insufficient documentation

## 2018-09-23 LAB — COMPREHENSIVE METABOLIC PANEL
ALT: 18 U/L (ref 0–44)
AST: 19 U/L (ref 15–41)
Albumin: 3.8 g/dL (ref 3.5–5.0)
Alkaline Phosphatase: 52 U/L (ref 38–126)
Anion gap: 11 (ref 5–15)
BUN: 16 mg/dL (ref 8–23)
CO2: 25 mmol/L (ref 22–32)
Calcium: 9.2 mg/dL (ref 8.9–10.3)
Chloride: 103 mmol/L (ref 98–111)
Creatinine, Ser: 0.94 mg/dL (ref 0.61–1.24)
GFR calc Af Amer: >60 mL/min (ref >60)
GFR calc non Af Amer: >60 mL/min (ref >60)
Glucose, Bld: 126 mg/dL — ABNORMAL HIGH (ref 70–99)
Potassium: 5.4 mmol/L — ABNORMAL HIGH (ref 3.5–5.1)
Sodium: 139 mmol/L (ref 135–145)
Total Bilirubin: 0.5 mg/dL (ref 0.3–1.2)
Total Protein: 7.3 g/dL (ref 6.5–8.1)

## 2018-09-23 LAB — CBC WITH DIFFERENTIAL/PLATELET
Abs Immature Granulocytes: 0.03 10*3/uL (ref 0.00–0.07)
Basophils Absolute: 0.1 10*3/uL (ref 0.0–0.1)
Basophils Relative: 1 %
Eosinophils Absolute: 0.6 10*3/uL — ABNORMAL HIGH (ref 0.0–0.5)
Eosinophils Relative: 6 %
HCT: 41.1 % (ref 39.0–52.0)
Hemoglobin: 13.1 g/dL (ref 13.0–17.0)
Immature Granulocytes: 0 %
Lymphocytes Relative: 21 %
Lymphs Abs: 2 10*3/uL (ref 0.7–4.0)
MCH: 30.5 pg (ref 26.0–34.0)
MCHC: 31.9 g/dL (ref 30.0–36.0)
MCV: 95.8 fL (ref 80.0–100.0)
Monocytes Absolute: 1 10*3/uL (ref 0.1–1.0)
Monocytes Relative: 11 %
Neutro Abs: 6.1 10*3/uL (ref 1.7–7.7)
Neutrophils Relative %: 61 %
Platelets: 391 10*3/uL (ref 150–400)
RBC: 4.29 MIL/uL (ref 4.22–5.81)
RDW: 14.2 % (ref 11.5–15.5)
WBC: 9.9 10*3/uL (ref 4.0–10.5)
nRBC: 0 % (ref 0.0–0.2)

## 2018-09-23 LAB — FERRITIN: Ferritin: 10 ng/mL — ABNORMAL LOW (ref 24–336)

## 2018-09-23 LAB — IRON AND TIBC
Iron: 63 ug/dL (ref 45–182)
Saturation Ratios: 16 % — ABNORMAL LOW (ref 17.9–39.5)
TIBC: 389 ug/dL (ref 250–450)
UIBC: 326 ug/dL

## 2018-09-23 LAB — FOLATE: Folate: 53.8 ng/mL (ref >5.9)

## 2018-09-23 LAB — VITAMIN B12: Vitamin B-12: 396 pg/mL (ref 180–914)

## 2018-09-24 ENCOUNTER — Other Ambulatory Visit: Payer: Self-pay

## 2018-09-24 ENCOUNTER — Inpatient Hospital Stay (HOSPITAL_BASED_OUTPATIENT_CLINIC_OR_DEPARTMENT_OTHER): Payer: Medicare Other | Admitting: Hematology

## 2018-09-24 ENCOUNTER — Encounter (HOSPITAL_COMMUNITY): Payer: Self-pay | Admitting: Hematology

## 2018-09-24 VITALS — BP 140/58 | HR 80 | Temp 98.2°F | Resp 18 | Wt 187.5 lb

## 2018-09-24 DIAGNOSIS — R911 Solitary pulmonary nodule: Secondary | ICD-10-CM

## 2018-09-24 DIAGNOSIS — Z87891 Personal history of nicotine dependence: Secondary | ICD-10-CM

## 2018-09-24 DIAGNOSIS — M25473 Effusion, unspecified ankle: Secondary | ICD-10-CM | POA: Diagnosis not present

## 2018-09-24 DIAGNOSIS — D473 Essential (hemorrhagic) thrombocythemia: Secondary | ICD-10-CM | POA: Diagnosis not present

## 2018-09-24 DIAGNOSIS — Z122 Encounter for screening for malignant neoplasm of respiratory organs: Secondary | ICD-10-CM

## 2018-09-24 DIAGNOSIS — R0602 Shortness of breath: Secondary | ICD-10-CM | POA: Diagnosis not present

## 2018-09-24 DIAGNOSIS — Z9081 Acquired absence of spleen: Secondary | ICD-10-CM

## 2018-09-24 NOTE — Patient Instructions (Signed)
[facility] at [facility] Discharge Instructions  You were seen today by Dr. Katragadda. He went over your recent lab results. He will see you back after scan  For follow up.  Thank you for choosing [facility] at [facility] to provide your oncology and hematology care.  To afford each patient quality time with our provider, please arrive at least 15 minutes before your scheduled appointment time.   If you have a lab appointment with the Cancer Center please come in thru the  Main Entrance and check in at the main information desk  You need to re-schedule your appointment should you arrive 10 or more minutes late.  We strive to give you quality time with our providers, and arriving late affects you and other patients whose appointments are after yours.  Also, if you no show three or more times for appointments you may be dismissed from the clinic at the providers discretion.     Again, thank you for choosing [facility].  Our hope is that these requests will decrease the amount of time that you wait before being seen by our physicians.       _____________________________________________________________  Should you have questions after your visit to [facility], please contact our office at [phone] between the hours of 8:00 a.m. and 4:30 p.m.  Voicemails left after 4:00 p.m. will not be returned until the following business day.  For prescription refill requests, have your pharmacy contact our office and allow 72 hours.    Cancer Center Support Programs:   > Cancer Support Group  2nd Tuesday of the month 1pm-2pm, Journey Room    
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
## 2018-10-10 DIAGNOSIS — I10 Essential (primary) hypertension: Secondary | ICD-10-CM | POA: Diagnosis not present

## 2018-10-10 DIAGNOSIS — E559 Vitamin D deficiency, unspecified: Secondary | ICD-10-CM | POA: Diagnosis not present

## 2018-10-10 DIAGNOSIS — D696 Thrombocytopenia, unspecified: Secondary | ICD-10-CM | POA: Diagnosis not present

## 2018-10-10 DIAGNOSIS — E7800 Pure hypercholesterolemia, unspecified: Secondary | ICD-10-CM | POA: Diagnosis not present

## 2018-10-10 DIAGNOSIS — E118 Type 2 diabetes mellitus with unspecified complications: Secondary | ICD-10-CM | POA: Diagnosis not present

## 2018-10-20 DIAGNOSIS — Z87820 Personal history of traumatic brain injury: Secondary | ICD-10-CM | POA: Diagnosis not present

## 2018-10-20 DIAGNOSIS — Z0001 Encounter for general adult medical examination with abnormal findings: Secondary | ICD-10-CM | POA: Diagnosis not present

## 2018-10-20 DIAGNOSIS — E7800 Pure hypercholesterolemia, unspecified: Secondary | ICD-10-CM | POA: Diagnosis not present

## 2018-10-20 DIAGNOSIS — E119 Type 2 diabetes mellitus without complications: Secondary | ICD-10-CM | POA: Diagnosis not present

## 2018-10-20 DIAGNOSIS — I10 Essential (primary) hypertension: Secondary | ICD-10-CM | POA: Diagnosis not present

## 2018-11-19 ENCOUNTER — Other Ambulatory Visit: Payer: Self-pay | Admitting: Interventional Cardiology

## 2018-11-26 ENCOUNTER — Ambulatory Visit (HOSPITAL_COMMUNITY)
Admission: RE | Admit: 2018-11-26 | Discharge: 2018-11-26 | Disposition: A | Discharge status: Home / Self Care | Payer: Medicare Other | Source: Ambulatory Visit | Attending: Pulmonary Disease | Admitting: Pulmonary Disease

## 2018-11-26 ENCOUNTER — Other Ambulatory Visit: Payer: Self-pay

## 2018-11-26 ENCOUNTER — Ambulatory Visit (HOSPITAL_COMMUNITY): Payer: Medicare Other

## 2018-11-26 ENCOUNTER — Inpatient Hospital Stay (HOSPITAL_COMMUNITY): Payer: Medicare Other | Attending: Hematology

## 2018-11-26 DIAGNOSIS — I272 Pulmonary hypertension, unspecified: Secondary | ICD-10-CM | POA: Diagnosis not present

## 2018-11-26 DIAGNOSIS — D473 Essential (hemorrhagic) thrombocythemia: Secondary | ICD-10-CM | POA: Insufficient documentation

## 2018-11-26 DIAGNOSIS — I2510 Atherosclerotic heart disease of native coronary artery without angina pectoris: Secondary | ICD-10-CM | POA: Insufficient documentation

## 2018-11-26 DIAGNOSIS — Z720 Tobacco use: Secondary | ICD-10-CM | POA: Insufficient documentation

## 2018-11-26 DIAGNOSIS — J432 Centrilobular emphysema: Secondary | ICD-10-CM

## 2018-11-26 DIAGNOSIS — Z9081 Acquired absence of spleen: Secondary | ICD-10-CM | POA: Insufficient documentation

## 2018-11-26 DIAGNOSIS — R0609 Other forms of dyspnea: Secondary | ICD-10-CM

## 2018-11-26 DIAGNOSIS — R0602 Shortness of breath: Secondary | ICD-10-CM | POA: Diagnosis not present

## 2018-11-26 DIAGNOSIS — E611 Iron deficiency: Secondary | ICD-10-CM | POA: Insufficient documentation

## 2018-11-26 DIAGNOSIS — J920 Pleural plaque with presence of asbestos: Secondary | ICD-10-CM

## 2018-11-26 DIAGNOSIS — Z122 Encounter for screening for malignant neoplasm of respiratory organs: Secondary | ICD-10-CM

## 2018-11-26 LAB — IRON AND TIBC
Iron: 66 ug/dL (ref 45–182)
Saturation Ratios: 19 % (ref 17.9–39.5)
TIBC: 352 ug/dL (ref 250–450)
UIBC: 286 ug/dL

## 2018-11-26 LAB — CBC WITH DIFFERENTIAL/PLATELET
Abs Immature Granulocytes: 0.03 10*3/uL (ref 0.00–0.07)
Basophils Absolute: 0.1 10*3/uL (ref 0.0–0.1)
Basophils Relative: 1 %
Eosinophils Absolute: 0.4 10*3/uL (ref 0.0–0.5)
Eosinophils Relative: 4 %
HCT: 42.4 % (ref 39.0–52.0)
Hemoglobin: 13.4 g/dL (ref 13.0–17.0)
Immature Granulocytes: 0 %
Lymphocytes Relative: 19 %
Lymphs Abs: 1.8 10*3/uL (ref 0.7–4.0)
MCH: 30 pg (ref 26.0–34.0)
MCHC: 31.6 g/dL (ref 30.0–36.0)
MCV: 94.9 fL (ref 80.0–100.0)
Monocytes Absolute: 0.8 10*3/uL (ref 0.1–1.0)
Monocytes Relative: 9 %
Neutro Abs: 6.4 10*3/uL (ref 1.7–7.7)
Neutrophils Relative %: 67 %
Platelets: 321 10*3/uL (ref 150–400)
RBC: 4.47 MIL/uL (ref 4.22–5.81)
RDW: 14.6 % (ref 11.5–15.5)
WBC: 9.5 10*3/uL (ref 4.0–10.5)
nRBC: 0 % (ref 0.0–0.2)

## 2018-11-26 LAB — FERRITIN: Ferritin: 13 ng/mL — ABNORMAL LOW (ref 24–336)

## 2018-12-01 ENCOUNTER — Inpatient Hospital Stay (HOSPITAL_COMMUNITY): Payer: Medicare Other | Admitting: Hematology

## 2018-12-01 ENCOUNTER — Other Ambulatory Visit: Payer: Self-pay

## 2018-12-01 ENCOUNTER — Encounter (HOSPITAL_COMMUNITY): Payer: Self-pay | Admitting: Hematology

## 2018-12-01 VITALS — BP 136/69 | HR 65 | Temp 97.9°F | Resp 18 | Wt 186.0 lb

## 2018-12-01 DIAGNOSIS — I2510 Atherosclerotic heart disease of native coronary artery without angina pectoris: Secondary | ICD-10-CM | POA: Diagnosis not present

## 2018-12-01 DIAGNOSIS — Z720 Tobacco use: Secondary | ICD-10-CM | POA: Diagnosis not present

## 2018-12-01 DIAGNOSIS — Z9081 Acquired absence of spleen: Secondary | ICD-10-CM

## 2018-12-01 DIAGNOSIS — Z122 Encounter for screening for malignant neoplasm of respiratory organs: Secondary | ICD-10-CM | POA: Diagnosis not present

## 2018-12-01 DIAGNOSIS — D75838 Other thrombocytosis: Secondary | ICD-10-CM

## 2018-12-01 DIAGNOSIS — R7989 Other specified abnormal findings of blood chemistry: Secondary | ICD-10-CM | POA: Diagnosis not present

## 2018-12-01 DIAGNOSIS — E611 Iron deficiency: Secondary | ICD-10-CM | POA: Diagnosis not present

## 2018-12-01 DIAGNOSIS — D473 Essential (hemorrhagic) thrombocythemia: Secondary | ICD-10-CM | POA: Diagnosis not present

## 2018-12-01 NOTE — Patient Instructions (Signed)
[facility] at [facility] Discharge Instructions  You were seen today by Dr. Katragadda. He went over your recent lab results. He will see you back in 6 months for labs and follow up.   Thank you for choosing [facility] at [facility] to provide your oncology and hematology care.  To afford each patient quality time with our provider, please arrive at least 15 minutes before your scheduled appointment time.   If you have a lab appointment with the Cancer Center please come in thru the  Main Entrance and check in at the main information desk  You need to re-schedule your appointment should you arrive 10 or more minutes late.  We strive to give you quality time with our providers, and arriving late affects you and other patients whose appointments are after yours.  Also, if you no show three or more times for appointments you may be dismissed from the clinic at the providers discretion.     Again, thank you for choosing [facility].  Our hope is that these requests will decrease the amount of time that you wait before being seen by our physicians.       _____________________________________________________________  Should you have questions after your visit to [facility], please contact our office at [phone] between the hours of 8:00 a.m. and 4:30 p.m.  Voicemails left after 4:00 p.m. will not be returned until the following business day.  For prescription refill requests, have your pharmacy contact our office and allow 72 hours.    Cancer Center Support Programs:   > Cancer Support Group  2nd Tuesday of the month 1pm-2pm, Journey Room    
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
## 2018-12-04 ENCOUNTER — Other Ambulatory Visit: Payer: Self-pay

## 2018-12-04 ENCOUNTER — Ambulatory Visit: Payer: Medicare Other | Admitting: Pulmonary Disease

## 2018-12-04 ENCOUNTER — Encounter: Payer: Self-pay | Admitting: Pulmonary Disease

## 2018-12-04 VITALS — BP 118/70 | HR 66 | Temp 97.7°F | Ht 71.0 in | Wt 182.0 lb

## 2018-12-04 DIAGNOSIS — I272 Pulmonary hypertension, unspecified: Secondary | ICD-10-CM | POA: Diagnosis not present

## 2018-12-04 DIAGNOSIS — J432 Centrilobular emphysema: Secondary | ICD-10-CM

## 2018-12-04 DIAGNOSIS — R918 Other nonspecific abnormal finding of lung field: Secondary | ICD-10-CM

## 2018-12-04 DIAGNOSIS — Z720 Tobacco use: Secondary | ICD-10-CM | POA: Diagnosis not present

## 2018-12-04 MED ORDER — ANORO ELLIPTA 62.5-25 MCG/INH IN AEPB: 1.0000 | INHALATION_SPRAY | Freq: Every day | RESPIRATORY_TRACT | 0 refills | Status: DC
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
## 2018-12-04 NOTE — Assessment & Plan Note (Signed)
Plan: Referral to lung cancer screening clinic in our office for future follow-up

## 2018-12-04 NOTE — Assessment & Plan Note (Signed)
Plan: Continue Anoro Ellipta Walk today in office patient was able to complete 2 laps We will order pulmonary function testing [facility] help [facility] with inhaler cost

## 2018-12-04 NOTE — Patient Instructions (Addendum)
You were seen today by Lauraine Rinne, NP  for:   1. Centrilobular emphysema (HCC)  Walk today   Anoro Ellipta  >>> Take 1 puff daily in the morning right when you wake up >>>Rinse your mouth out after use >>>This is a daily maintenance inhaler, NOT a rescue inhaler >>>Contact our office if you are having difficulties affording or obtaining this medication >>>It is important for you to be able to take this daily and not miss any doses    Only use your albuterol as a rescue medication to be used if you can't catch your breath by resting or doing a relaxed purse lip breathing pattern.  - The less you use it, the better it will work when you need it. - Ok to use up to 2 puffs  every 4 hours if you must but call for immediate appointment if use goes up over your usual need - Don't leave home without it !!  (think of it like the spare tire for your car)   [facility] for help with inhaler cost  2. Tobacco abuse  Continue to not smoke  Referral to lung cancer screening program, in our clinic   We will refer you today to your 69 lung cancer screening program >>>This is based off of your pack-year smoking history >>> This is a recommendation from the [place] preventative services task force (USPSTF) >>>The USPSTF recommends annual screening for lung cancer with low-dose computed tomography (LDCT) in adults aged 19 to 80 years who have a 30 pack-year smoking history and currently smoke or have quit within the past 15 years. Screening should be discontinued once a person has not smoked for 15 years or develops a health problem that substantially limits life expectancy or the ability or willingness to have curative lung surgery.   Our office will call you and set up an appointment with Eric Form (Nurse Practitioner) who leads this program.  This appointment takes place in our office.  After completing this meeting with Eric Form NP you will get a low-dose CT as the  screening >>>We will call you with those results    3. Pulmonary hypertension ([place])  Please complete pulmonary function testing prior to next office visit  We will go ahead and order home sleep study to further evaluate elevated pulmonary pressures   We recommend today:  Orders Placed This Encounter  Procedures  . AMB Referral to [facility]    Referral Priority:   Routine    Referral Type:   Consultation    Referral Reason:   [facility]    Number of Visits Requested:   1  . Ambulatory Referral for Lung Cancer Scre    Referral Priority:   Routine    Referral Type:   Consultation    Referral Reason:   Specialty Services Required    Number of Visits Requested:   1  . Home sleep test    Standing Status:   Future    Standing Expiration Date:   12/04/2019    Order Specific Question:   Where should this test be performed:    Answer:   [facility] - Pulmonary   Orders Placed This Encounter  Procedures  . AMB Referral to [facility]  . Ambulatory Referral for Lung Cancer Scre  . Home sleep test   No orders of the defined types were placed in this encounter.   Follow Up:    Return in about 2  months (around 02/03/2019), or if symptoms worsen or fail to improve, for Follow up for PFT, Follow up with Dr. Valeta Harms.   Please do your part to reduce the spread of COVID-19:      Reduce your risk of any infection  and COVID19 by using the similar precautions used for avoiding the common cold or flu:  Marland Kitchen Wash your hands often with soap and warm water for at least 20 seconds.  If soap and water are not readily available, use an alcohol-based hand sanitizer with at least 60% alcohol.  . If coughing or sneezing, cover your mouth and nose by coughing or sneezing into the elbow areas of your shirt or coat, into a tissue or into your sleeve (not your hands). Langley Gauss A MASK when in public  . Avoid shaking hands with others and consider head nods or verbal greetings only. .  Avoid touching your eyes, nose, or mouth with unwashed hands.  . Avoid close contact with people who are sick. . Avoid places or events with large numbers of people in one location, like concerts or sporting events. . If you have some symptoms but not all symptoms, continue to monitor at home and seek medical attention if your symptoms worsen. . If you are having a medical emergency, call 911.   [place] / e-Visit: [email or web address]         [facility]: [facility]: [phone]                   [facility]: [phone]     It is flu season:   >>> Best ways to protect herself from the flu: Receive the yearly flu vaccine, practice good hand hygiene washing with soap and also using hand sanitizer when available, eat a nutritious meals, get adequate rest, hydrate appropriately   Please contact the office if your symptoms worsen or you have concerns that you are not improving.   Thank you for choosing [facility] for your healthcare, and for allowing [place] to partner with you on your healthcare journey. I am thankful to be able to provide care to you today.   Wyn Quaker FNP-C

## 2018-12-04 NOTE — Assessment & Plan Note (Signed)
Plan: We will coordinate pulmonary function testing We will order a home sleep study Continue Anoro Ellipta

## 2018-12-06 NOTE — Progress Notes (Signed)
PCCM:  Thanks for seeing him  Garner Nash, DO [facility] 12/06/2018 2:30 PM

## 2018-12-09 ENCOUNTER — Other Ambulatory Visit: Payer: Self-pay | Admitting: *Deleted
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
## 2018-12-10 ENCOUNTER — Other Ambulatory Visit: Payer: Self-pay | Admitting: *Deleted
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
## 2018-12-11 ENCOUNTER — Other Ambulatory Visit: Payer: Self-pay | Admitting: *Deleted
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
## 2018-12-12 ENCOUNTER — Other Ambulatory Visit: Payer: Self-pay | Admitting: *Deleted

## 2018-12-12 ENCOUNTER — Encounter: Payer: Self-pay | Admitting: *Deleted
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
## 2018-12-18 ENCOUNTER — Other Ambulatory Visit: Payer: Self-pay | Admitting: Pharmacist

## 2018-12-18 NOTE — Patient Outreach (Signed)
[facility]) Care Management  [place]  12/18/2018  HIROTO KEMNA 02-Jul-1950 [id number]   Reason for referral: Medication Assistance  Referral source: [facility] RN Current insurance: [facility]  Outreach:  Unsuccessful telephone call attempt #1 to patient.   HIPAA compliant voicemail left requesting a return call  Plan:  -I will mail patient an unsuccessful outreach letter.  -I will make another outreach attempt to patient within 3-4 business days.    Regina Eck, PharmD, [place]  [phone]

## 2018-12-23 ENCOUNTER — Ambulatory Visit: Payer: Self-pay | Admitting: Pharmacist

## 2018-12-23 ENCOUNTER — Other Ambulatory Visit: Payer: Self-pay | Admitting: Pharmacist

## 2018-12-23 NOTE — Patient Outreach (Signed)
[place] [facility]) Care Management  [place]  12/23/2018  MAYCOL SHOTTS 1950-10-10 [id number]   Reason for referral: Medication Assistance  Referral source: [facility] RN Current insurance: [facility]  Outreach:  Unsuccessful telephone call attempt #2 to patient.   HIPAA compliant voicemail left requesting a return call  Plan:  -Noted that another [facility] discipline has recently mailed patient an unsuccessful outreach letter.  -I will make another outreach attempt to patient within 3-4 business days.    Regina Eck, PharmD, [place]  [phone]

## 2018-12-30 ENCOUNTER — Telehealth: Payer: Self-pay

## 2018-12-30 ENCOUNTER — Ambulatory Visit: Payer: Self-pay | Admitting: Pharmacist

## 2018-12-30 NOTE — Telephone Encounter (Signed)
Spoke with pt and advised him that I didn't know who called him but reminded him he had an appt on Friday to receive a sleep study machine for a HST. Pt understood and I advised him that if they needed to then they would call him back.  ' I thought maybe it was for the Covid screening. He was negative for all questions. Will close encounter.

## 2019-01-01 ENCOUNTER — Other Ambulatory Visit: Payer: Self-pay | Admitting: Pharmacist

## 2019-01-01 NOTE — Patient Outreach (Signed)
[facility] [facility]) Care Management [place]  01/01/2019  Garrett Mckinney 1950-12-20 [id number]  Reason for referral: medication assistance  [facility] pharmacy case is being closed due to the following reasons:  -Unable to engage or maintain contact with patient    Regina Eck, PharmD, [place]  [phone]

## 2019-01-02 ENCOUNTER — Ambulatory Visit: Payer: Medicare Other

## 2019-01-02 ENCOUNTER — Other Ambulatory Visit: Payer: Self-pay

## 2019-01-02 DIAGNOSIS — I272 Pulmonary hypertension, unspecified: Secondary | ICD-10-CM

## 2019-01-03 DIAGNOSIS — G4733 Obstructive sleep apnea (adult) (pediatric): Secondary | ICD-10-CM | POA: Diagnosis not present

## 2019-01-06 DIAGNOSIS — G4733 Obstructive sleep apnea (adult) (pediatric): Secondary | ICD-10-CM | POA: Diagnosis not present

## 2019-01-26 ENCOUNTER — Other Ambulatory Visit: Payer: Self-pay | Admitting: Emergency Medicine

## 2019-01-30 ENCOUNTER — Other Ambulatory Visit (HOSPITAL_COMMUNITY)
Admission: RE | Admit: 2019-01-30 | Discharge: 2019-01-30 | Disposition: A | Discharge status: Home / Self Care | Payer: Medicare Other | Source: Ambulatory Visit | Attending: Emergency Medicine | Admitting: Emergency Medicine

## 2019-01-30 ENCOUNTER — Other Ambulatory Visit: Payer: Self-pay

## 2019-01-30 DIAGNOSIS — Z20828 Contact with and (suspected) exposure to other viral communicable diseases: Secondary | ICD-10-CM | POA: Diagnosis not present

## 2019-01-30 DIAGNOSIS — Z01812 Encounter for preprocedural laboratory examination: Secondary | ICD-10-CM | POA: Diagnosis not present

## 2019-01-30 LAB — SARS CORONAVIRUS 2 (TAT 6-24 HRS): SARS Coronavirus 2: NEGATIVE

## 2019-02-04 ENCOUNTER — Encounter: Payer: Self-pay | Admitting: Pulmonary Disease

## 2019-02-04 ENCOUNTER — Ambulatory Visit (INDEPENDENT_AMBULATORY_CARE_PROVIDER_SITE_OTHER): Payer: Medicare Other | Admitting: Pulmonary Disease

## 2019-02-04 ENCOUNTER — Other Ambulatory Visit: Payer: Self-pay

## 2019-02-04 ENCOUNTER — Ambulatory Visit: Payer: Medicare Other | Admitting: Pulmonary Disease

## 2019-02-04 VITALS — BP 116/68 | HR 74 | Ht 69.0 in | Wt 185.0 lb

## 2019-02-04 DIAGNOSIS — Z720 Tobacco use: Secondary | ICD-10-CM

## 2019-02-04 DIAGNOSIS — J920 Pleural plaque with presence of asbestos: Secondary | ICD-10-CM

## 2019-02-04 DIAGNOSIS — Z23 Encounter for immunization: Secondary | ICD-10-CM | POA: Diagnosis not present

## 2019-02-04 DIAGNOSIS — R0609 Other forms of dyspnea: Secondary | ICD-10-CM

## 2019-02-04 DIAGNOSIS — R0600 Dyspnea, unspecified: Secondary | ICD-10-CM | POA: Diagnosis not present

## 2019-02-04 DIAGNOSIS — I272 Pulmonary hypertension, unspecified: Secondary | ICD-10-CM

## 2019-02-04 DIAGNOSIS — R918 Other nonspecific abnormal finding of lung field: Secondary | ICD-10-CM

## 2019-02-04 DIAGNOSIS — J432 Centrilobular emphysema: Secondary | ICD-10-CM

## 2019-02-04 LAB — PULMONARY FUNCTION TEST
DL/VA % pred: 106 %
DL/VA: 4.31 ml/min/mmHg/L
DLCO unc % pred: 69 %
DLCO unc: 17.4 ml/min/mmHg
FEF 25-75 Post: 0.66 L/sec
FEF 25-75 Pre: 0.45 L/sec
FEF2575-%Change-Post: 46 %
FEF2575-%Pred-Post: 27 %
FEF2575-%Pred-Pre: 18 %
FEV1-%Change-Post: 18 %
FEV1-%Pred-Post: 42 %
FEV1-%Pred-Pre: 35 %
FEV1-Post: 1.32 L
FEV1-Pre: 1.11 L
FEV1FVC-%Change-Post: -1 %
FEV1FVC-%Pred-Pre: 61 %
FEV6-%Change-Post: 18 %
FEV6-%Pred-Post: 70 %
FEV6-%Pred-Pre: 59 %
FEV6-Post: 2.83 L
FEV6-Pre: 2.4 L
FEV6FVC-%Change-Post: -1 %
FEV6FVC-%Pred-Post: 101 %
FEV6FVC-%Pred-Pre: 103 %
FVC-%Change-Post: 20 %
FVC-%Pred-Post: 69 %
FVC-%Pred-Pre: 57 %
FVC-Post: 2.96 L
FVC-Pre: 2.46 L
Post FEV1/FVC ratio: 45 %
Post FEV6/FVC ratio: 96 %
Pre FEV1/FVC ratio: 45 %
Pre FEV6/FVC Ratio: 97 %
RV % pred: 146 %
RV: 3.5 L
TLC % pred: 95 %
TLC: 6.53 L

## 2019-02-04 MED ORDER — ALBUTEROL SULFATE HFA 108 (90 BASE) MCG/ACT IN AERS: 2.0000 | INHALATION_SPRAY | Freq: Four times a day (QID) | RESPIRATORY_TRACT | 6 refills | Status: AC | PRN

## 2019-02-04 NOTE — Progress Notes (Signed)
Full PFT performed today. °
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
## 2019-02-04 NOTE — Patient Instructions (Addendum)
Thank you for visiting Dr. Valeta Harms at [facility]. Today we recommend the following:  Continue current inhaler regimen: Anoro + Albuterol inhaler Please call with any questions Flu shot today   Return in about 6 months (around 08/05/2019).    Please do your part to reduce the spread of COVID-19.

## 2019-03-02 ENCOUNTER — Other Ambulatory Visit: Payer: Self-pay | Admitting: Interventional Cardiology

## 2019-03-02 DIAGNOSIS — I2510 Atherosclerotic heart disease of native coronary artery without angina pectoris: Secondary | ICD-10-CM

## 2019-04-20 DIAGNOSIS — I10 Essential (primary) hypertension: Secondary | ICD-10-CM | POA: Diagnosis not present

## 2019-04-20 DIAGNOSIS — E7800 Pure hypercholesterolemia, unspecified: Secondary | ICD-10-CM | POA: Diagnosis not present

## 2019-04-20 DIAGNOSIS — E118 Type 2 diabetes mellitus with unspecified complications: Secondary | ICD-10-CM | POA: Diagnosis not present

## 2019-04-20 DIAGNOSIS — R748 Abnormal levels of other serum enzymes: Secondary | ICD-10-CM | POA: Diagnosis not present

## 2019-04-22 DIAGNOSIS — Z79899 Other long term (current) drug therapy: Secondary | ICD-10-CM | POA: Diagnosis not present

## 2019-04-22 DIAGNOSIS — I10 Essential (primary) hypertension: Secondary | ICD-10-CM | POA: Diagnosis not present

## 2019-04-22 DIAGNOSIS — Z7984 Long term (current) use of oral hypoglycemic drugs: Secondary | ICD-10-CM | POA: Diagnosis not present

## 2019-04-22 DIAGNOSIS — E7800 Pure hypercholesterolemia, unspecified: Secondary | ICD-10-CM | POA: Diagnosis not present

## 2019-04-22 DIAGNOSIS — E119 Type 2 diabetes mellitus without complications: Secondary | ICD-10-CM | POA: Diagnosis not present

## 2019-05-05 ENCOUNTER — Encounter: Payer: Self-pay | Admitting: Cardiovascular Disease

## 2019-05-05 ENCOUNTER — Encounter (INDEPENDENT_AMBULATORY_CARE_PROVIDER_SITE_OTHER): Payer: Self-pay

## 2019-05-05 ENCOUNTER — Ambulatory Visit: Payer: Medicare Other | Admitting: Cardiovascular Disease

## 2019-05-05 ENCOUNTER — Other Ambulatory Visit: Payer: Self-pay

## 2019-05-05 VITALS — BP 126/76 | HR 61 | Ht 71.0 in | Wt 178.5 lb

## 2019-05-05 DIAGNOSIS — I2510 Atherosclerotic heart disease of native coronary artery without angina pectoris: Secondary | ICD-10-CM

## 2019-05-05 DIAGNOSIS — J449 Chronic obstructive pulmonary disease, unspecified: Secondary | ICD-10-CM | POA: Diagnosis not present

## 2019-05-05 DIAGNOSIS — E785 Hyperlipidemia, unspecified: Secondary | ICD-10-CM

## 2019-05-05 DIAGNOSIS — I272 Pulmonary hypertension, unspecified: Secondary | ICD-10-CM

## 2019-05-05 DIAGNOSIS — E782 Mixed hyperlipidemia: Secondary | ICD-10-CM | POA: Diagnosis not present

## 2019-05-05 DIAGNOSIS — I2119 ST elevation (STEMI) myocardial infarction involving other coronary artery of inferior wall: Secondary | ICD-10-CM | POA: Diagnosis not present

## 2019-05-05 NOTE — Patient Instructions (Signed)
Medication Instructions:  Your physician recommends that you continue on your current medications as directed. Please refer to the Current Medication list given to you today.  *If you need a refill on your cardiac medications before your next appointment, please call your pharmacy*  Lab Work: None Ordered If you have labs (blood work) drawn today and your tests are completely normal, you will receive your results only by: Marland Kitchen MyChart Message (if you have MyChart) OR . A paper copy in the mail If you have any lab test that is abnormal or we need to change your treatment, we will call you to review the results.   Testing/Procedures: None Ordered   Follow-Up: At [facility], you and your health needs are our priority.  As part of our continuing mission to provide you with exceptional heart care, we have created designated Provider Care Teams.  These Care Teams include your primary Cardiologist (physician) and Advanced Practice Providers (APPs -  Physician Assistants and Nurse Practitioners) who all work together to provide you with the care you need, when you need it.  Your next appointment:   1 year(s)  The format for your next appointment:   In Person  Provider:   You may see [name], Brooke Bonito., MD or one of the following Advanced Practice Providers on your designated Care Team:    Richardson Dopp, PA-C  [place], [place]  Daune Perch, [place]
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
## 2019-05-27 ENCOUNTER — Other Ambulatory Visit: Payer: Self-pay | Admitting: Cardiovascular Disease

## 2019-05-27 ENCOUNTER — Other Ambulatory Visit: Payer: Self-pay | Admitting: Interventional Cardiology

## 2019-05-27 DIAGNOSIS — I2510 Atherosclerotic heart disease of native coronary artery without angina pectoris: Secondary | ICD-10-CM

## 2019-05-28 ENCOUNTER — Other Ambulatory Visit: Payer: Self-pay | Admitting: *Deleted

## 2019-05-28 DIAGNOSIS — F17210 Nicotine dependence, cigarettes, uncomplicated: Secondary | ICD-10-CM

## 2019-05-28 DIAGNOSIS — Z87891 Personal history of nicotine dependence: Secondary | ICD-10-CM

## 2019-06-01 ENCOUNTER — Inpatient Hospital Stay (HOSPITAL_COMMUNITY): Payer: Medicare Other | Attending: Hematology

## 2019-06-01 DIAGNOSIS — E611 Iron deficiency: Secondary | ICD-10-CM | POA: Insufficient documentation

## 2019-06-01 DIAGNOSIS — F17210 Nicotine dependence, cigarettes, uncomplicated: Secondary | ICD-10-CM | POA: Insufficient documentation

## 2019-06-01 DIAGNOSIS — D473 Essential (hemorrhagic) thrombocythemia: Secondary | ICD-10-CM | POA: Insufficient documentation

## 2019-06-08 ENCOUNTER — Other Ambulatory Visit: Payer: Self-pay

## 2019-06-08 ENCOUNTER — Ambulatory Visit (HOSPITAL_COMMUNITY): Payer: Medicare Other | Admitting: Hematology

## 2019-06-08 ENCOUNTER — Inpatient Hospital Stay (HOSPITAL_COMMUNITY): Payer: Medicare Other

## 2019-06-08 DIAGNOSIS — D473 Essential (hemorrhagic) thrombocythemia: Secondary | ICD-10-CM | POA: Diagnosis not present

## 2019-06-08 DIAGNOSIS — Z9081 Acquired absence of spleen: Secondary | ICD-10-CM

## 2019-06-08 DIAGNOSIS — F17210 Nicotine dependence, cigarettes, uncomplicated: Secondary | ICD-10-CM | POA: Diagnosis not present

## 2019-06-08 DIAGNOSIS — E611 Iron deficiency: Secondary | ICD-10-CM | POA: Diagnosis not present

## 2019-06-08 LAB — CBC WITH DIFFERENTIAL/PLATELET
Abs Immature Granulocytes: 0.02 10*3/uL (ref 0.00–0.07)
Basophils Absolute: 0.1 10*3/uL (ref 0.0–0.1)
Basophils Relative: 1 %
Eosinophils Absolute: 0.3 10*3/uL (ref 0.0–0.5)
Eosinophils Relative: 4 %
HCT: 45.5 % (ref 39.0–52.0)
Hemoglobin: 14.3 g/dL (ref 13.0–17.0)
Immature Granulocytes: 0 %
Lymphocytes Relative: 18 %
Lymphs Abs: 1.7 10*3/uL (ref 0.7–4.0)
MCH: 30.4 pg (ref 26.0–34.0)
MCHC: 31.4 g/dL (ref 30.0–36.0)
MCV: 96.8 fL (ref 80.0–100.0)
Monocytes Absolute: 1 10*3/uL (ref 0.1–1.0)
Monocytes Relative: 10 %
Neutro Abs: 6.2 10*3/uL (ref 1.7–7.7)
Neutrophils Relative %: 67 %
Platelets: 413 10*3/uL — ABNORMAL HIGH (ref 150–400)
RBC: 4.7 MIL/uL (ref 4.22–5.81)
RDW: 14.4 % (ref 11.5–15.5)
WBC: 9.3 10*3/uL (ref 4.0–10.5)
nRBC: 0 % (ref 0.0–0.2)

## 2019-06-08 LAB — COMPREHENSIVE METABOLIC PANEL
ALT: 15 U/L (ref 0–44)
AST: 16 U/L (ref 15–41)
Albumin: 3.8 g/dL (ref 3.5–5.0)
Alkaline Phosphatase: 62 U/L (ref 38–126)
Anion gap: 8 (ref 5–15)
BUN: 13 mg/dL (ref 8–23)
CO2: 28 mmol/L (ref 22–32)
Calcium: 9.4 mg/dL (ref 8.9–10.3)
Chloride: 104 mmol/L (ref 98–111)
Creatinine, Ser: 0.87 mg/dL (ref 0.61–1.24)
GFR calc Af Amer: >60 mL/min (ref >60)
GFR calc non Af Amer: >60 mL/min (ref >60)
Glucose, Bld: 120 mg/dL — ABNORMAL HIGH (ref 70–99)
Potassium: 4.9 mmol/L (ref 3.5–5.1)
Sodium: 140 mmol/L (ref 135–145)
Total Bilirubin: 0.6 mg/dL (ref 0.3–1.2)
Total Protein: 7.7 g/dL (ref 6.5–8.1)

## 2019-06-08 LAB — IRON AND TIBC
Iron: 59 ug/dL (ref 45–182)
Saturation Ratios: 16 % — ABNORMAL LOW (ref 17.9–39.5)
TIBC: 372 ug/dL (ref 250–450)
UIBC: 313 ug/dL

## 2019-06-08 LAB — FERRITIN: Ferritin: 17 ng/mL — ABNORMAL LOW (ref 24–336)

## 2019-06-08 LAB — VITAMIN B12: Vitamin B-12: 443 pg/mL (ref 180–914)

## 2019-06-08 LAB — FOLATE: Folate: 57.5 ng/mL (ref >5.9)

## 2019-06-15 ENCOUNTER — Encounter (HOSPITAL_COMMUNITY): Payer: Self-pay | Admitting: Hematology

## 2019-06-15 ENCOUNTER — Other Ambulatory Visit: Payer: Self-pay

## 2019-06-15 ENCOUNTER — Inpatient Hospital Stay (HOSPITAL_COMMUNITY): Payer: Medicare Other | Admitting: Hematology

## 2019-06-15 VITALS — BP 152/69 | HR 61 | Temp 97.9°F | Resp 18 | Wt 181.8 lb

## 2019-06-15 DIAGNOSIS — D473 Essential (hemorrhagic) thrombocythemia: Secondary | ICD-10-CM | POA: Diagnosis not present

## 2019-06-15 DIAGNOSIS — Z122 Encounter for screening for malignant neoplasm of respiratory organs: Secondary | ICD-10-CM

## 2019-06-15 DIAGNOSIS — E611 Iron deficiency: Secondary | ICD-10-CM | POA: Diagnosis not present

## 2019-06-15 DIAGNOSIS — D75838 Other thrombocytosis: Secondary | ICD-10-CM

## 2019-06-15 DIAGNOSIS — Z9081 Acquired absence of spleen: Secondary | ICD-10-CM | POA: Diagnosis not present
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
## 2019-06-15 NOTE — Patient Instructions (Signed)
[facility] Discharge Instructions  You were seen today by Dr. Katragadda. He went over your recent lab results. He will see you back in 6 months for labs and follow up.   Thank you for choosing [facility] at [facility] to provide your oncology and hematology care.  To afford each patient quality time with our provider, please arrive at least 15 minutes before your scheduled appointment time.   If you have a lab appointment with the Cancer Center please come in thru the  Main Entrance and check in at the main information desk  You need to re-schedule your appointment should you arrive 10 or more minutes late.  We strive to give you quality time with our providers, and arriving late affects you and other patients whose appointments are after yours.  Also, if you no show three or more times for appointments you may be dismissed from the clinic at the providers discretion.     Again, thank you for choosing [facility].  Our hope is that these requests will decrease the amount of time that you wait before being seen by our physicians.       _____________________________________________________________  Should you have questions after your visit to [facility], please contact our office at [phone] between the hours of 8:00 a.m. and 4:30 p.m.  Voicemails left after 4:00 p.m. will not be returned until the following business day.  For prescription refill requests, have your pharmacy contact our office and allow 72 hours.    Cancer Center Support Programs:   > Cancer Support Group  2nd Tuesday of the month 1pm-2pm, Journey Room    
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
## 2019-06-22 ENCOUNTER — Encounter: Payer: Self-pay | Admitting: Acute Care

## 2019-06-22 ENCOUNTER — Ambulatory Visit (INDEPENDENT_AMBULATORY_CARE_PROVIDER_SITE_OTHER): Payer: Medicare Other | Admitting: Acute Care

## 2019-06-22 ENCOUNTER — Other Ambulatory Visit: Payer: Self-pay

## 2019-06-22 ENCOUNTER — Ambulatory Visit (INDEPENDENT_AMBULATORY_CARE_PROVIDER_SITE_OTHER)
Admission: RE | Admit: 2019-06-22 | Discharge: 2019-06-22 | Disposition: A | Discharge status: Home / Self Care | Payer: Medicare Other | Source: Ambulatory Visit | Attending: Acute Care | Admitting: Acute Care

## 2019-06-22 VITALS — BP 140/70 | HR 76 | Temp 97.3°F | Ht 71.0 in | Wt 179.4 lb

## 2019-06-22 DIAGNOSIS — F17210 Nicotine dependence, cigarettes, uncomplicated: Secondary | ICD-10-CM | POA: Diagnosis not present

## 2019-06-22 DIAGNOSIS — Z87891 Personal history of nicotine dependence: Secondary | ICD-10-CM | POA: Diagnosis not present

## 2019-06-22 NOTE — Patient Instructions (Signed)
Thank you for participating in the [facility]. It was our pleasure to meet you today. We will call you with the results of your scan within the next few days. Your scan will be assigned a Lung RADS category score by the physicians reading the scans.  This Lung RADS score determines follow up scanning.  See below for description of categories, and follow up screening recommendations. We will be in touch to schedule your follow up screening annually or based on recommendations of our providers. We will fax a copy of your scan results to your Primary Care Physician, or the physician who referred you to the program, to ensure they have the results. Please call the office if you have any questions or concerns regarding your scanning experience or results.  Our office number is [phone]. Please speak with Denise Phelps, RN. She is our Lung Cancer Screening RN. If she is unavailable when you call, please have the office staff send her a message. She will return your call at her earliest convenience. Remember, if your scan is normal, we will scan you annually as long as you continue to meet the criteria for the program. (Age 55-77, Current smoker or smoker who has quit within the last 15 years). If you are a smoker, remember, quitting is the single most powerful action that you can take to decrease your risk of lung cancer and other pulmonary, breathing related problems. We know quitting is hard, and we are here to help.  Please let us know if there is anything we can do to help you meet your goal of quitting. If you are a former smoker, congratulations. We are proud of you! Remain smoke free! Remember you can refer friends or family members through the number above.  We will screen them to make sure they meet criteria for the program. Thank you for helping us take better care of you by participating in Lung Screening.  Lung RADS Categories:  Lung RADS 1: no nodules  or definitely non-concerning nodules.  Recommendation is for a repeat annual scan in 12 months.  Lung RADS 2:  nodules that are non-concerning in appearance and behavior with a very low likelihood of becoming an active cancer. Recommendation is for a repeat annual scan in 12 months.  Lung RADS 3: nodules that are probably non-concerning , includes nodules with a low likelihood of becoming an active cancer.  Recommendation is for a 6-month repeat screening scan. Often noted after an upper respiratory illness. We will be in touch to make sure you have no questions, and to schedule your 6-month scan.  Lung RADS 4 A: nodules with concerning findings, recommendation is most often for a follow up scan in 3 months or additional testing based on our provider's assessment of the scan. We will be in touch to make sure you have no questions and to schedule the recommended 3 month follow up scan.  Lung RADS 4 B:  indicates findings that are concerning. We will be in touch with you to schedule additional diagnostic testing based on our provider's  assessment of the scan.   

## 2019-06-22 NOTE — Progress Notes (Signed)
Shared Decision Making Visit Lung Cancer Screening Program [id number])   Eligibility:  Age 69 y.o.  Pack Years Smoking History Calculation 69 pack year smoking history (# packs/per year x # years smoked)  Recent History of coughing up blood  no  Unexplained weight loss? no ( >Than 15 pounds within the last 6 months )  Prior History Lung / other cancer no (Diagnosis within the last 5 years already requiring surveillance chest CT Scans).  Smoking Status Current Smoker  Former Smokers: Years since quit: NA  Quit Date: NA  Visit Components:  Discussion included one or more decision making aids. yes  Discussion included risk/benefits of screening. yes  Discussion included potential follow up diagnostic testing for abnormal scans. yes  Discussion included meaning and risk of over diagnosis. yes  Discussion included meaning and risk of False Positives. yes  Discussion included meaning of total radiation exposure. yes  Counseling Included:  Importance of adherence to annual lung cancer LDCT screening. yes  Impact of comorbidities on ability to participate in the program. yes  Ability and willingness to under diagnostic treatment. yes  Smoking Cessation Counseling:  Current Smokers:   Discussed importance of smoking cessation. yes  Information about tobacco cessation classes and interventions provided to patient. yes  Patient provided with "ticket" for LDCT Scan. yes  Symptomatic Patient. no  Counseling NA  Diagnosis Code: Tobacco Use Z72.0  Asymptomatic Patient yes  Counseling (Intermediate counseling: > three minutes counseling) [id number]  Former Smokers:   Discussed the importance of maintaining cigarette abstinence. yes  Diagnosis Code: Personal History of Nicotine Dependence. [id number]  Information about tobacco cessation classes and interventions provided to patient. Yes  Patient provided with "ticket" for LDCT Scan. yes  Written Order for Lung Cancer  Screening with LDCT placed in Epic. Yes (CT Chest Lung Cancer Screening Low Dose W/O CM) [id number] Z12.2-Screening of respiratory organs Z87.891-Personal history of nicotine dependence  Pt. Has agreed to appointment with pharmacy for smoking cessation. Appointment is 07/01/2019 at 1:30 pm.  BP 140/70 (BP Location: Left Arm, Cuff Size: Normal)   Pulse 76   Temp (!) 97.3 F (36.3 C) (Temporal)   Ht 5\' 11"  (1.803 m)   Wt 179 lb 6.4 oz (81.4 kg)   SpO2 90%   BMI 25.02 kg/m    I have spent 25 minutes of face to face time with Mr. Walsingham discussing the risks and benefits of lung cancer screening. We viewed a power point together that explained in detail the above noted topics. We paused at intervals to allow for questions to be asked and answered to ensure understanding.We discussed that the single most powerful action that he can take to decrease he risk of developing lung cancer is to quit smoking. We discussed whether or not he is ready to commit to setting a quit date. We discussed options for tools to aid in quitting smoking including nicotine replacement therapy, non-nicotine medications, support groups, Quit Smart classes, and behavior modification. We discussed that often times setting smaller, more achievable goals, such as eliminating 1 cigarette a day for a week and then 2 cigarettes a day for a week can be helpful in slowly decreasing the number of cigarettes smoked. This allows for a sense of accomplishment as well as providing a clinical benefit. I gave him the " Be Stronger Than Your Excuses" card with contact information for community resources, classes, free nicotine replacement therapy, and access to mobile apps, text messaging, and on-line smoking cessation help. I have  also given him my card and contact information in the event he needs to contact me. We discussed the time and location of the scan, and that either Doroteo Glassman RN or I will call with the results within 24-48 hours of  receiving them. I have offered him  a copy of the power point we viewed  as a resource in the event they need reinforcement of the concepts we discussed today in the office. The patient verbalized understanding of all of  the above and had no further questions upon leaving the office. They have my contact information in the event they have any further questions.  I spent 3 minutes counseling patient on smoking cessation and the health risks of continued tobacco abuse.  I explained to the patient that there has been a high incidence of coronary artery disease noted on these exams. I explained that this is a non-gated exam therefore degree or severity cannot be determined. This patient is currently on statin therapy. I have asked the patient to follow-up with their PCP regarding any incidental finding of coronary artery disease and management with diet or medication as their PCP  feels is clinically indicated. The patient verbalized understanding of the above and had no further questions upon completion of the visit.      Magdalen Spatz, NP 06/22/2019 2:32 PM

## 2019-06-25 NOTE — Progress Notes (Signed)
Please call patient and let them  know their  low dose Ct was read as a Lung RADS 2: nodules that are benign in appearance and behavior with a very low likelihood of becoming a clinically active cancer due to size or lack of growth. Recommendation per radiology is for a repeat LDCT in 12 months. .Please let them  know we will order and schedule their  annual screening scan for 06/2020. Please let them  know there was notation of CAD on their  scan.  Please remind the patient  that this is a non-gated exam therefore degree or severity of disease  cannot be determined. Please have them  follow up with their PCP regarding potential risk factor modification, dietary therapy or pharmacologic therapy if clinically indicated. Pt.  is  currently on statin therapy. Please place order for annual  screening scan for  06/2020 and fax results to PCP. Thanks so much.  Pt is followed by Pulmonary/ CCM and cardiology. PAH is a known diagnosis. Pt had last echo 05/05/2018.

## 2019-06-26 ENCOUNTER — Other Ambulatory Visit: Payer: Self-pay | Admitting: *Deleted

## 2019-06-26 DIAGNOSIS — Z87891 Personal history of nicotine dependence: Secondary | ICD-10-CM

## 2019-06-26 DIAGNOSIS — F17210 Nicotine dependence, cigarettes, uncomplicated: Secondary | ICD-10-CM

## 2019-06-26 NOTE — Progress Notes (Signed)
Subjective Patient presents to [facility] and seen by the pharmacist for smoking cessation counseling.  Patient was referred by Eric Form, NP and last seen in our office on 06/22/2019.  Patient states that he is ready to quit smoking.  Social History   Tobacco Use  Smoking Status Former Smoker  . Packs/day: 1.50  . Years: 46.00  . Pack years: 69.00  . Types: Cigarettes  . Quit date: 04/2018  . Years since quitting: 1.1  Smokeless Tobacco Never Used     Tobacco Use History  Age when started using tobacco on a daily basis 68 .  Type: cigarettes.  Number of cigarettes per day 8, brand ultra light (whatever is cheapest).  Smokes first cigarette 60 minutes after waking.  Does not wake at night to smoke.  Triggers: stress and anixety, after meals  Quit Attempt History   Most recent quit attempt 2011.  Longest time ever been tobacco free a year.  Methods tried in the past include None. He has quit "cold [place]" in the cigarettes  Rates IMPORTANCE of quitting tobacco on 1-10 scale of 9 .  Rates READINESS of quitting tobacco on 1-10 scale of 9.  Rates CONFIDENCE of quitting tobacco on 1-10 scale of 7.  Motivators to quitting include health and family   Immunization History  Administered Date(s) Administered  . Fluad Quad(high Dose 65+) 02/04/2019     Assessment and Plan  1. Smoking Cessation  a. Provided information on 1 800-QUIT NOW support program.  b. Non-Pharmacologic i. Patient will try to work in his yard, cook, complete a crossword puzzle and use flavored toothpicks as alternative habits when he wants to smoke a cigarettes.  c. Pharmacologic i. Initiated nicotine replacement tx with nicotine patch and lozenges. Patient counseled on purpose, proper use, and potential adverse effects.  1. Nicotine Patch a. Patient counseled on purpose, proper use, and potential adverse effects, including mild itching or redness at the point of application, headache,  trouble sleeping, and/or vivid dreams    b. Patch Schedule for >10 cigarettes daily i. Weeks 1-6: one 21 mg patch daily ii. Weeks 7-8: one 14 mg patch daily iii. Weeks 9-10: one 7 mg patch daily c. Patch Schedule for <10 cigarettes daily i. Weeks 1 to 6: one nicotine patch (14 mg) daily. I will call and re-assess how you are doing at the end of 6 weeks to see how you are doing on 14 mg patch and if you are ready to decrease dose of patch. ii. Weeks 7-8: one nicotine patch (7 mg) daily 2. Nicotine Lozenge a. Patient counseled on purpose, proper use, and potential adverse effects including nausea, hiccups, cough, and heartburn.  Instructed patient to use  2 mg unless the smoke within 30 minutes of waking up in which they should use 4 mg. b. Lozenge dosing schedule i. Weeks 1 to 6: 1 lozenge every 1 to 2 hours (maximum: 5 lozenges every 6 hours; 20 lozenges/day); to increase chances of quitting, use at least 9 lozenges/day during the first 6 weeks ii. Weeks 7 to 9: 1 lozenge every 2 to 4 hours (maximum: 5 lozenges every 6 hours; 20 lozenges/day) iii. Weeks 10 to 12: 1 lozenge every 4 to 8 hours (maximum: 5 lozenges every 6 hours; 20 lozenges/day)  2. Medication Reconciliation a. A drug regimen assessment was performed, including review of allergies, interactions, disease-state management, dosing and immunization history. Medications were reviewed with the patient, including name, instructions, indication, goals of therapy, potential  side effects, importance of adherence, and safe use.  All questions encouraged and answered.  Instructed patient to reach out with any further questions or concerns.  He was scheduled for a follow-up appointment 1 month after his quit date on April 29 at 1:30 PM.  Thank you for allowing pharmacy to participate in this patient's care.  This appointment required 40 minutes of patient care (this includes precharting, chart review, review of results, face-to-face care,  etc.).   Mariella Saa, PharmD, [place], [place] Clinical Specialty Pharmacist [phone]  07/01/2019 3:33 PM

## 2019-06-29 ENCOUNTER — Ambulatory Visit: Payer: Medicare Other

## 2019-07-01 ENCOUNTER — Other Ambulatory Visit: Payer: Self-pay

## 2019-07-01 ENCOUNTER — Ambulatory Visit (INDEPENDENT_AMBULATORY_CARE_PROVIDER_SITE_OTHER): Payer: Medicare Other | Admitting: Pharmacist

## 2019-07-01 DIAGNOSIS — F17210 Nicotine dependence, cigarettes, uncomplicated: Secondary | ICD-10-CM | POA: Diagnosis not present

## 2019-07-01 MED ORDER — NICOTINE POLACRILEX 4 MG MT LOZG: 4.0000 mg | LOZENGE | OROMUCOSAL | 5 refills | Status: DC | PRN

## 2019-07-01 MED ORDER — NICOTINE 14 MG/24HR TD PT24: 14.0000 mg | MEDICATED_PATCH | Freq: Every day | TRANSDERMAL | 5 refills | Status: DC

## 2019-07-01 NOTE — Patient Instructions (Addendum)
STAR Quit Plan.   S-set quit date: April 1st, 2021 T-tell everyone A-anticipate challenges: Stress, anxiety, after meals. Patient picked 4 alternative activities to try instead of smoking a cigarette 1) crossword puzzle 2) work in yard and lawnmover 3) use flavored toothpicks 4) cook R- remove all tobacco products: patient will trash cigarettes, lighters, and ash trays by March 31st  Nicotine patch  Start the day of your quit date. Use daily for 24 hours.  If it keeps you up at night you can wear for 12 hours.  Weeks 1 to 6: one nicotine patch (14 mg) daily. I will call and re-assess how you are doing at the end of 6 weeks to see how you are doing on 14 mg patch and if you are ready to decrease dose of patch.  Weeks 7-8: one nicotine patch (7 mg) daily  Nicotine Lozenge  Weeks 1 to 6: 1 lozenge every 1 to 2 hours (maximum: 5 lozenges every 6 hours; 20 lozenges/day); to increase chances of quitting, use at least 9 lozenges/day during the first 6 weeks  Weeks 7 to 9: 1 lozenge every 2 to 4 hours (maximum: 5 lozenges every 6 hours; 20 lozenges/day)  Weeks 10 to 12: 1 lozenge every 4 to 8 hours (maximum: 5 lozenges every 6 hours; 20 lozenges/day)  Thank you for meeting with the pharmacy team.  Please call with any questions or concerns.

## 2019-07-02 ENCOUNTER — Telehealth: Payer: Self-pay | Admitting: Acute Care

## 2019-07-02 ENCOUNTER — Other Ambulatory Visit: Payer: Self-pay | Admitting: Cardiovascular Disease

## 2019-07-02 DIAGNOSIS — I2510 Atherosclerotic heart disease of native coronary artery without angina pectoris: Secondary | ICD-10-CM

## 2019-07-02 MED ORDER — ANORO ELLIPTA 62.5-25 MCG/INH IN AEPB: 1.0000 | INHALATION_SPRAY | Freq: Every day | RESPIRATORY_TRACT | 3 refills | Status: AC

## 2019-07-02 NOTE — Telephone Encounter (Signed)
Spoke with pt and advised that refills for Anoro were sent to pharmacy. Pt verbalized . Nothing further needed.

## 2019-08-18 ENCOUNTER — Other Ambulatory Visit: Payer: Self-pay

## 2019-08-18 ENCOUNTER — Ambulatory Visit: Payer: Medicare Other | Admitting: Pharmacist

## 2019-08-18 DIAGNOSIS — Z716 Tobacco abuse counseling: Secondary | ICD-10-CM

## 2019-08-18 NOTE — Progress Notes (Signed)
   Subjective Patient presents to [facility] and seen by the pharmacist for smoking cessation counseling.  He smokes a pack a week if stressed.  He started to work in his yard which has helped alleviate cravings. Nicotine patch raised blood pressure and blood sugar so he stopped taking.  He tried the nicotine lozenge by itself but states it still increased his blood sugar.  He is not smoking the whole cigarette.  He also states that he used to drink alcohol frequently but has eliminated alcohol since 1999.   Social History   Tobacco Use  Smoking Status Former Smoker  . Packs/day: 1.50  . Years: 46.00  . Pack years: 69.00  . Types: Cigarettes  . Quit date: 04/2018  . Years since quitting: 1.3  Smokeless Tobacco Never Used      Immunization History  Administered Date(s) Administered  . Fluad Quad(high Dose 65+) 02/04/2019     Assessment and Plan  1. Smoking Cessation   Patient was not able to quit by quit date of April 1st but has significantly decreased the amount he smokes.  He was unable to tolerate the nicotine patch and lozenges as he states it increase his blood sugar and blood pressure.  He has started using alternative activities discussed at last office visit such as doing yard work and doing crossword puzzles.  He states he was happy as he discovered he is quite good at completing crossword puzzles compared to his peers.  Congratulated patient on the progress he is made in reducing the amount he smokes.  Discussed smoking cessation agents Chantix and Wellbutrin as alternative options to nicotine replacement therapy.  Patient declined Chantix or Wellbutrin.  Discussed setting small attainable goals to decrease the amount of cigarettes he smokes versus setting very hard quit date.  Patient is amenable.  Patient goal is to decrease by 1 cigarette daily in the next 3 weeks.  Will call patient to follow-up on progress in 3 weeks.  2. Medication Reconciliation A drug  regimen assessment was performed, including review of allergies, interactions, disease-state management, dosing and immunization history. Medications were reviewed with the patient, including name, instructions, indication, goals of therapy, potential side effects, importance of adherence, and safe use.  All questions encouraged and answered.  Instructed patient to call with any further questions or concerns.  This appointment required 30 minutes of patient care (this includes precharting, chart review, review of results, face-to-face care, etc.).   Mariella Saa, PharmD, [place], [place] Clinical Specialty Pharmacist [phone]  08/18/2019 5:35 PM

## 2019-08-18 NOTE — Patient Instructions (Addendum)
   CONTINIUE alternative activities to try instead of smoking a cigarette 1) crossword puzzle 2) work in yard and lawnmover 3) use flavored toothpicks 4) cook.  GOAL: decrease by 1 cigarette daily in 3 weeks  Will call to follow up in 3 weeks.

## 2019-08-20 ENCOUNTER — Ambulatory Visit: Payer: Medicare Other

## 2019-10-21 DIAGNOSIS — Z0000 Encounter for general adult medical examination without abnormal findings: Secondary | ICD-10-CM | POA: Diagnosis not present

## 2019-10-21 DIAGNOSIS — E118 Type 2 diabetes mellitus with unspecified complications: Secondary | ICD-10-CM | POA: Diagnosis not present

## 2019-10-21 DIAGNOSIS — Z79899 Other long term (current) drug therapy: Secondary | ICD-10-CM | POA: Diagnosis not present

## 2019-10-21 DIAGNOSIS — E7800 Pure hypercholesterolemia, unspecified: Secondary | ICD-10-CM | POA: Diagnosis not present

## 2019-11-13 DIAGNOSIS — E7800 Pure hypercholesterolemia, unspecified: Secondary | ICD-10-CM | POA: Diagnosis not present

## 2019-11-13 DIAGNOSIS — I10 Essential (primary) hypertension: Secondary | ICD-10-CM | POA: Diagnosis not present

## 2019-11-13 DIAGNOSIS — Z79899 Other long term (current) drug therapy: Secondary | ICD-10-CM | POA: Diagnosis not present

## 2019-11-13 DIAGNOSIS — E118 Type 2 diabetes mellitus with unspecified complications: Secondary | ICD-10-CM | POA: Diagnosis not present

## 2019-11-19 DIAGNOSIS — I10 Essential (primary) hypertension: Secondary | ICD-10-CM | POA: Diagnosis not present

## 2019-11-19 DIAGNOSIS — E7800 Pure hypercholesterolemia, unspecified: Secondary | ICD-10-CM | POA: Diagnosis not present

## 2019-11-19 DIAGNOSIS — I2510 Atherosclerotic heart disease of native coronary artery without angina pectoris: Secondary | ICD-10-CM | POA: Diagnosis not present

## 2019-11-19 DIAGNOSIS — Z7984 Long term (current) use of oral hypoglycemic drugs: Secondary | ICD-10-CM | POA: Diagnosis not present

## 2019-11-19 DIAGNOSIS — E119 Type 2 diabetes mellitus without complications: Secondary | ICD-10-CM | POA: Diagnosis not present

## 2019-12-07 ENCOUNTER — Other Ambulatory Visit: Payer: Self-pay

## 2019-12-07 ENCOUNTER — Inpatient Hospital Stay (HOSPITAL_COMMUNITY): Payer: Medicare Other | Attending: Hematology

## 2019-12-07 DIAGNOSIS — D75838 Other thrombocytosis: Secondary | ICD-10-CM

## 2019-12-07 DIAGNOSIS — F17210 Nicotine dependence, cigarettes, uncomplicated: Secondary | ICD-10-CM | POA: Insufficient documentation

## 2019-12-07 DIAGNOSIS — D509 Iron deficiency anemia, unspecified: Secondary | ICD-10-CM | POA: Insufficient documentation

## 2019-12-07 DIAGNOSIS — Z9081 Acquired absence of spleen: Secondary | ICD-10-CM

## 2019-12-07 DIAGNOSIS — D473 Essential (hemorrhagic) thrombocythemia: Secondary | ICD-10-CM | POA: Diagnosis present

## 2019-12-07 LAB — CBC WITH DIFFERENTIAL/PLATELET
Abs Immature Granulocytes: 0.02 10*3/uL (ref 0.00–0.07)
Basophils Absolute: 0.1 10*3/uL (ref 0.0–0.1)
Basophils Relative: 1 %
Eosinophils Absolute: 0.4 10*3/uL (ref 0.0–0.5)
Eosinophils Relative: 5 %
HCT: 45.3 % (ref 39.0–52.0)
Hemoglobin: 14.4 g/dL (ref 13.0–17.0)
Immature Granulocytes: 0 %
Lymphocytes Relative: 21 %
Lymphs Abs: 1.9 10*3/uL (ref 0.7–4.0)
MCH: 30.6 pg (ref 26.0–34.0)
MCHC: 31.8 g/dL (ref 30.0–36.0)
MCV: 96.2 fL (ref 80.0–100.0)
Monocytes Absolute: 0.9 10*3/uL (ref 0.1–1.0)
Monocytes Relative: 10 %
Neutro Abs: 5.6 10*3/uL (ref 1.7–7.7)
Neutrophils Relative %: 63 %
Platelets: 290 10*3/uL (ref 150–400)
RBC: 4.71 MIL/uL (ref 4.22–5.81)
RDW: 14.1 % (ref 11.5–15.5)
WBC: 9 10*3/uL (ref 4.0–10.5)
nRBC: 0 % (ref 0.0–0.2)

## 2019-12-07 LAB — COMPREHENSIVE METABOLIC PANEL
ALT: 19 U/L (ref 0–44)
AST: 18 U/L (ref 15–41)
Albumin: 3.6 g/dL (ref 3.5–5.0)
Alkaline Phosphatase: 60 U/L (ref 38–126)
Anion gap: 7 (ref 5–15)
BUN: 16 mg/dL (ref 8–23)
CO2: 28 mmol/L (ref 22–32)
Calcium: 8.9 mg/dL (ref 8.9–10.3)
Chloride: 104 mmol/L (ref 98–111)
Creatinine, Ser: 0.8 mg/dL (ref 0.61–1.24)
GFR calc Af Amer: >60 mL/min (ref >60)
GFR calc non Af Amer: >60 mL/min (ref >60)
Glucose, Bld: 97 mg/dL (ref 70–99)
Potassium: 4.6 mmol/L (ref 3.5–5.1)
Sodium: 139 mmol/L (ref 135–145)
Total Bilirubin: 0.8 mg/dL (ref 0.3–1.2)
Total Protein: 7.1 g/dL (ref 6.5–8.1)

## 2019-12-07 LAB — FOLATE: Folate: 45.3 ng/mL (ref >5.9)

## 2019-12-07 LAB — IRON AND TIBC
Iron: 110 ug/dL (ref 45–182)
Saturation Ratios: 31 % (ref 17.9–39.5)
TIBC: 360 ug/dL (ref 250–450)
UIBC: 250 ug/dL

## 2019-12-07 LAB — VITAMIN B12: Vitamin B-12: 453 pg/mL (ref 180–914)

## 2019-12-07 LAB — FERRITIN: Ferritin: 22 ng/mL — ABNORMAL LOW (ref 24–336)

## 2019-12-14 ENCOUNTER — Other Ambulatory Visit: Payer: Self-pay

## 2019-12-14 ENCOUNTER — Inpatient Hospital Stay (HOSPITAL_COMMUNITY): Payer: Medicare Other | Admitting: Hematology

## 2019-12-14 VITALS — BP 174/83 | HR 73 | Temp 97.1°F | Resp 18 | Wt 181.6 lb

## 2019-12-14 DIAGNOSIS — D473 Essential (hemorrhagic) thrombocythemia: Secondary | ICD-10-CM | POA: Diagnosis not present

## 2019-12-14 DIAGNOSIS — Z9081 Acquired absence of spleen: Secondary | ICD-10-CM

## 2019-12-14 DIAGNOSIS — D75838 Other thrombocytosis: Secondary | ICD-10-CM

## 2019-12-14 NOTE — Patient Instructions (Signed)
[facility] Discharge Instructions  You were seen today by Dr. Delton Coombes. He went over your recent results and scans. You will be scheduled for a CT scan of your chest before your next visit. Your next appointment will be with the nurse practitioner in 7 months for labs and follow up.   Thank you for choosing [place] at [facility] to provide your oncology and hematology care.  To afford each patient quality time with our provider, please arrive at least 15 minutes before your scheduled appointment time.   If you have a lab appointment with the [facility] please come in thru the Main Entrance and check in at the main information desk  You need to re-schedule your appointment should you arrive 10 or more minutes late.  We strive to give you quality time with our providers, and arriving late affects you and other patients whose appointments are after yours.  Also, if you no show three or more times for appointments you may be dismissed from the clinic at the providers discretion.     Again, thank you for choosing [facility]  Our hope is that these requests will decrease the amount of time that you wait before being seen by our physicians.       _____________________________________________________________  Should you have questions after your visit to [facility], please contact our office at [phone] between the hours of 8:00 a.m. and 4:30 p.m.  Voicemails left after 4:00 p.m. will not be returned until the following business day.  For prescription refill requests, have your pharmacy contact our office and allow 72 hours.    Cancer Center Support Programs:   > Cancer Support Group  2nd Tuesday of the month 1pm-2pm, Journey Room
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
## 2020-01-05 ENCOUNTER — Telehealth: Payer: Self-pay | Admitting: Pulmonary Disease

## 2020-01-06 NOTE — Telephone Encounter (Signed)
Marked patient deceased -pr

## 2020-01-22 DEATH — deceased

## 2020-07-13 ENCOUNTER — Other Ambulatory Visit (HOSPITAL_COMMUNITY): Payer: Medicare Other

## 2020-07-13 ENCOUNTER — Ambulatory Visit (HOSPITAL_COMMUNITY): Payer: Medicare Other

## 2020-07-18 ENCOUNTER — Ambulatory Visit (HOSPITAL_COMMUNITY): Payer: Medicare Other | Admitting: Hematology

## 2020-08-10 IMAGING — US ULTRASOUND SCROTUM DOPPLER COMPLETE
1 series · 13 of 25 positions shown · non-contrast
Comparison: None.

CLINICAL DATA: Initial evaluation for orchitis for 3 weeks.

EXAM:
SCROTAL ULTRASOUND
DOPPLER ULTRASOUND OF THE TESTICLES
TECHNIQUE: Complete ultrasound examination of the testicles, epididymis, and
other scrotal structures was performed. Color and spectral Doppler
ultrasound were also utilized to evaluate blood flow to the
testicles.

[Series 1: ultrasound scrotum doppler complete · 0.06mm/px · 13 of 76 slices shown]
[im 1/76]
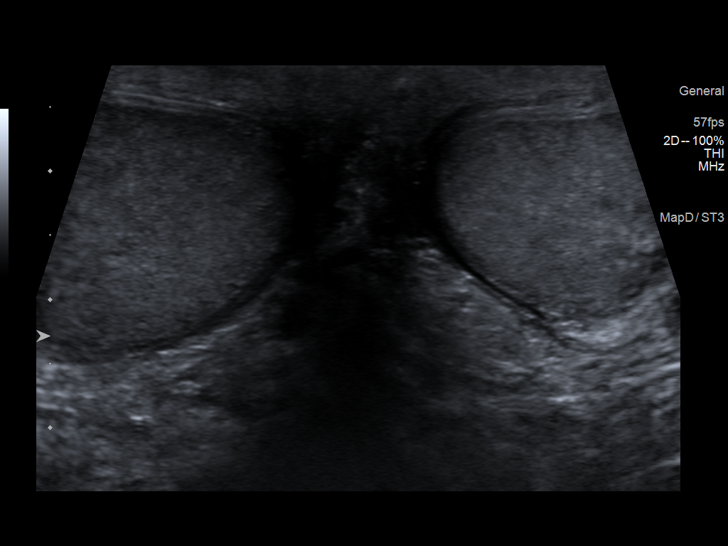
[im 7/76]
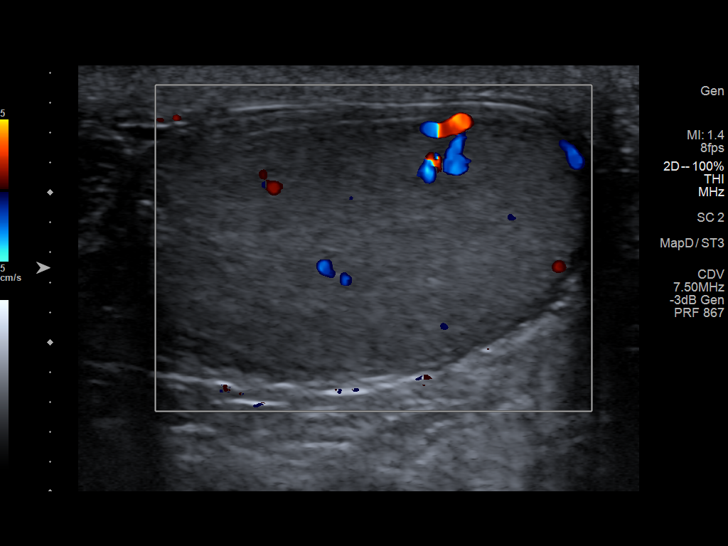
[im 13/76]
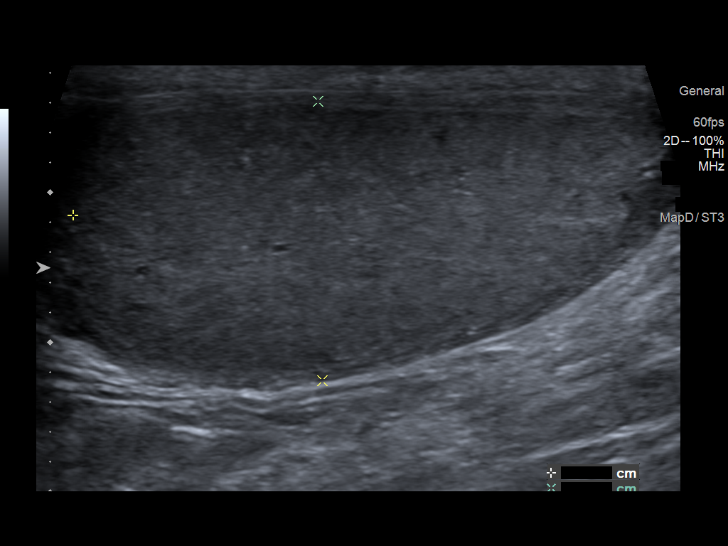
[im 19/76]
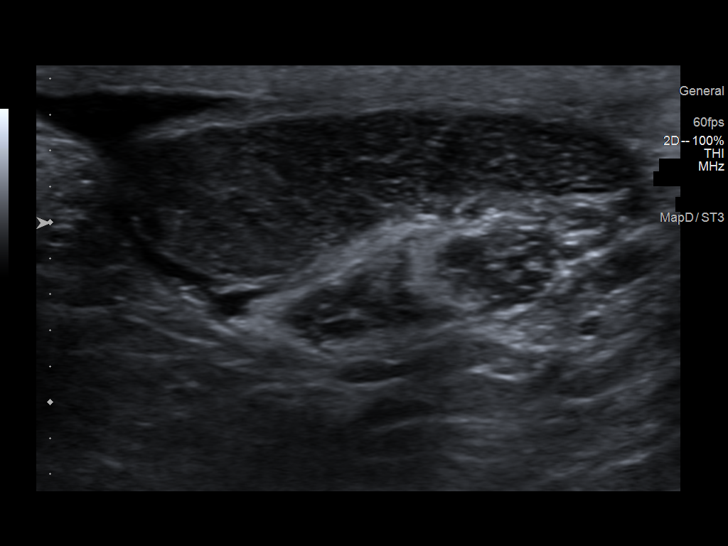
[im 26/76]
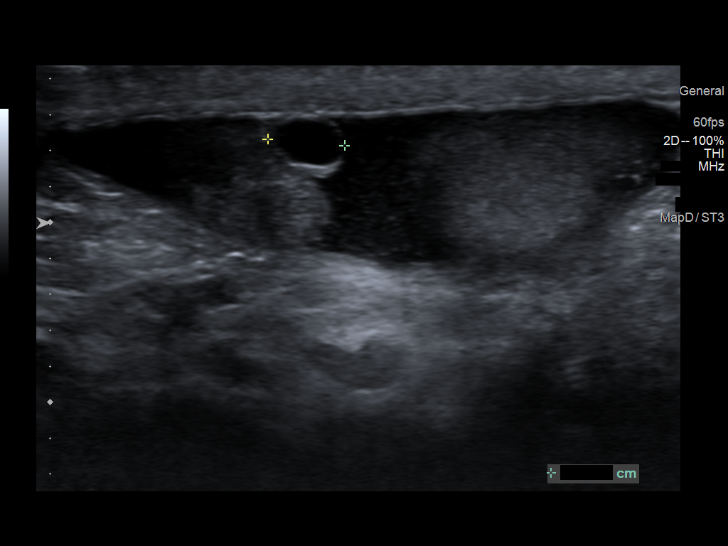
[im 32/76]
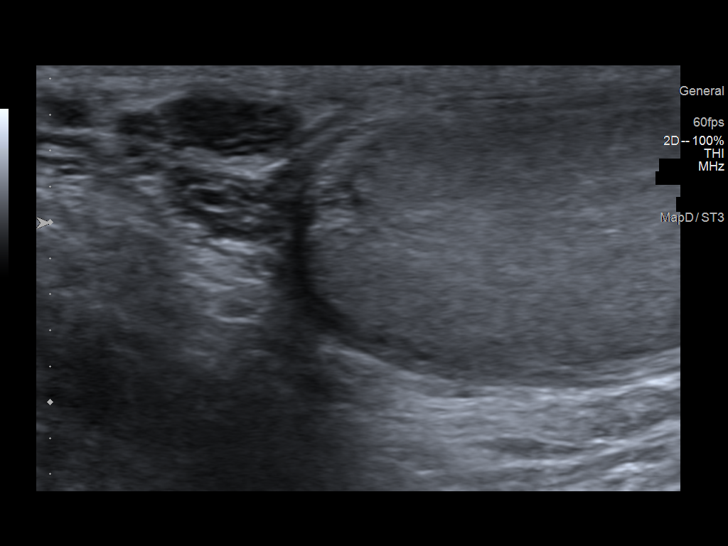
[im 38/76]
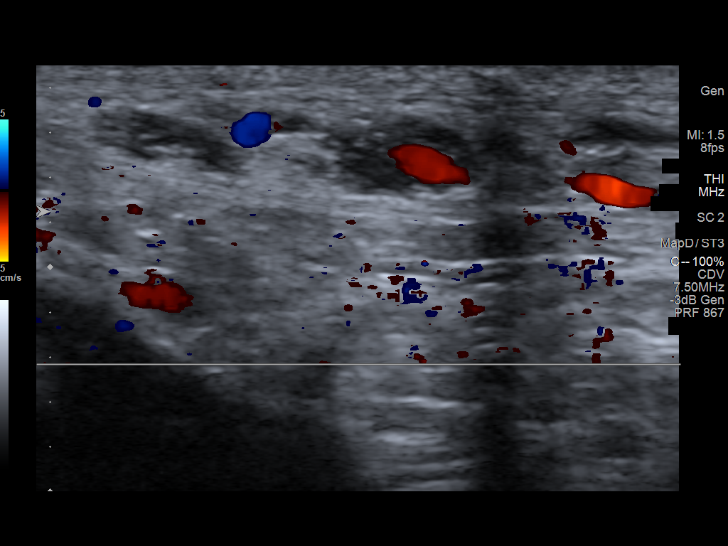
[im 44/76]
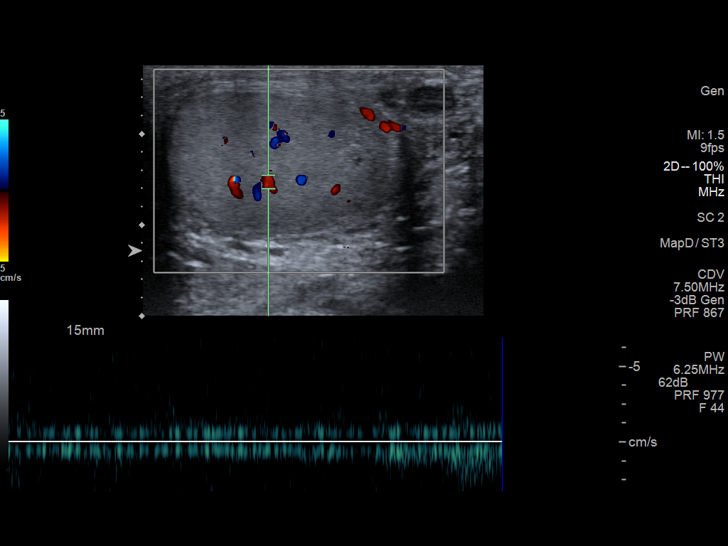
[im 51/76]
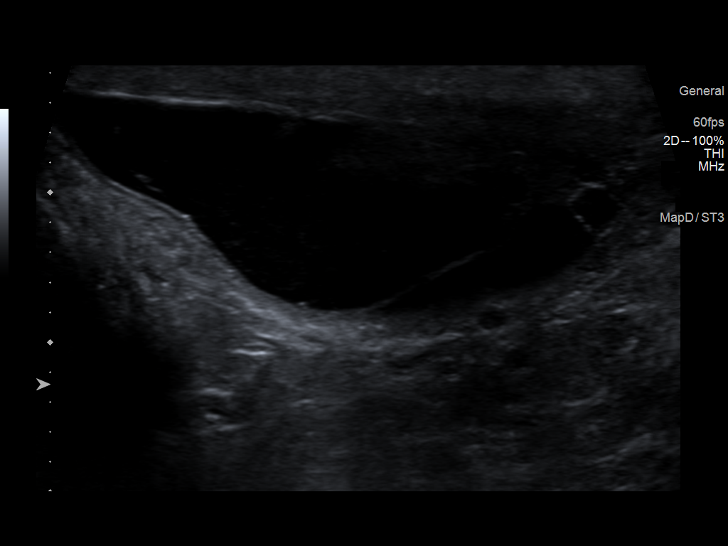
[im 57/76]
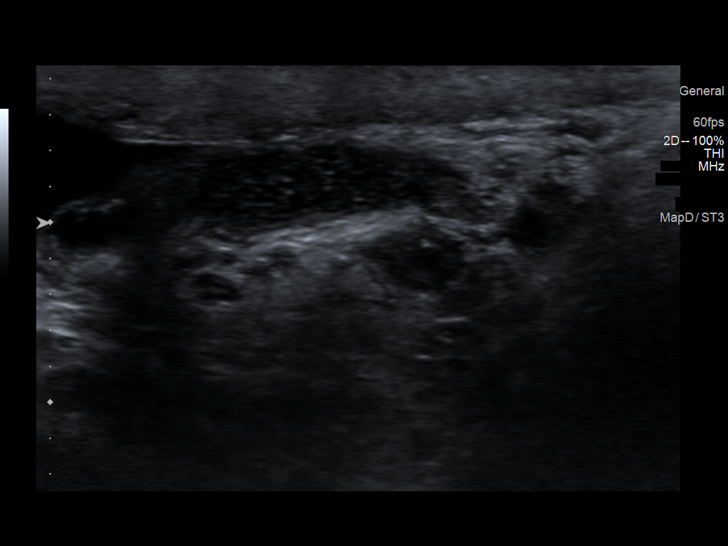
[im 63/76]
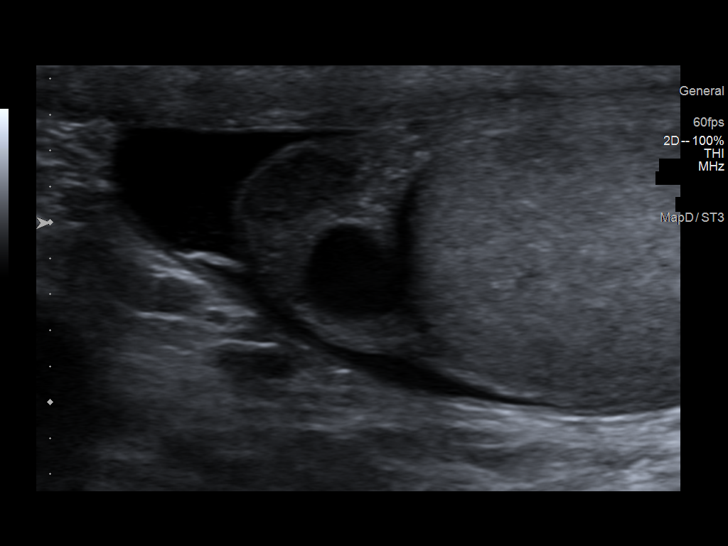
[im 69/76]
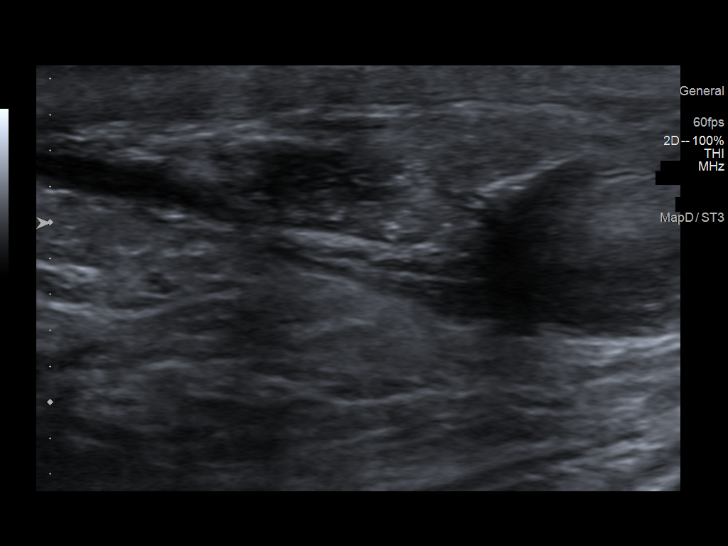
[im 76/76]
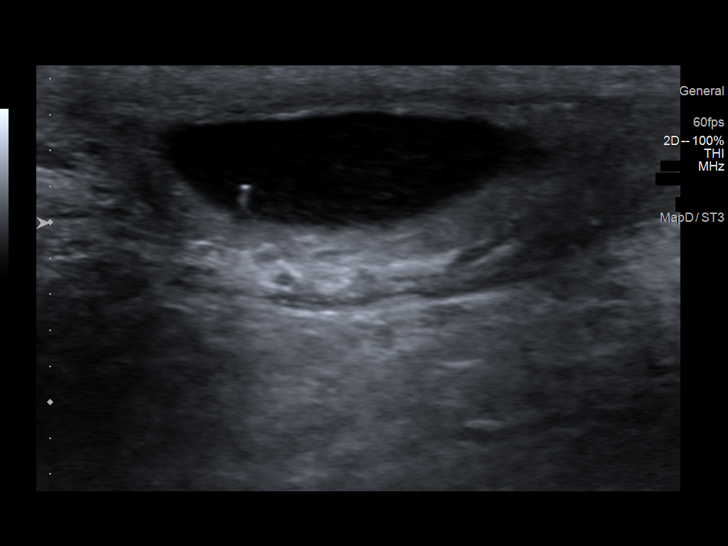

[13 of 25 positions shown; findings below may reference images not displayed]

FINDINGS: Right testicle

Measurements: 4.1 x 1.9 x 2.8 cm. No mass or microlithiasis
visualized.

Left testicle

Measurements: 4.2 x 1.8 x 2.8 cm. No mass or microlithiasis
visualized.

Right epididymis: Normal in size and appearance. 4 mm simple
exophytic cyst seen at the right epididymal head, most consistent
with a benign epididymal cyst/spermatocele.

Left epididymis: Normal in size and appearance. 5 x 5 x 5 mm simple
cyst at the left epididymal head, most compatible with a small
benign epididymal cyst/spermatocele.

Hydrocele: Small complex left-sided hydrocele with few scattered
internal septations and probable debris. Additional trace simple
right sided hydrocele.

Varicocele:  None visualized.

Pulsed Doppler interrogation of both testes demonstrates normal low
resistance arterial and venous waveforms bilaterally.
IMPRESSION: 1. No sonographic evidence for acute epididymo-orchitis at this
time.
2. Small complex partially septated left-sided hydrocele, which
could be related to recent infection/inflammation.
3. Incidental small 4-5 mm bilateral epididymal cysts as above, most
compatible with benign epididymal cysts/spermatoceles.
